# Patient Record
Sex: Female | Born: 1950 | Race: White | Hispanic: No | Marital: Married | Smoking: Former smoker
Health system: Southern US, Community
[De-identification: ages and names within clinical notes are randomized; demographics above are authoritative.]

## PROBLEM LIST (undated history)

## (undated) DIAGNOSIS — J189 Pneumonia, unspecified organism: Secondary | ICD-10-CM

## (undated) DIAGNOSIS — Z87442 Personal history of urinary calculi: Secondary | ICD-10-CM

## (undated) DIAGNOSIS — F1010 Alcohol abuse, uncomplicated: Secondary | ICD-10-CM

## (undated) DIAGNOSIS — J45909 Unspecified asthma, uncomplicated: Secondary | ICD-10-CM

## (undated) DIAGNOSIS — M1990 Unspecified osteoarthritis, unspecified site: Secondary | ICD-10-CM

## (undated) DIAGNOSIS — F419 Anxiety disorder, unspecified: Secondary | ICD-10-CM

## (undated) DIAGNOSIS — E46 Unspecified protein-calorie malnutrition: Secondary | ICD-10-CM

## (undated) DIAGNOSIS — M549 Dorsalgia, unspecified: Secondary | ICD-10-CM

## (undated) DIAGNOSIS — M797 Fibromyalgia: Secondary | ICD-10-CM

## (undated) DIAGNOSIS — K219 Gastro-esophageal reflux disease without esophagitis: Secondary | ICD-10-CM

## (undated) DIAGNOSIS — F39 Unspecified mood [affective] disorder: Secondary | ICD-10-CM

## (undated) DIAGNOSIS — F909 Attention-deficit hyperactivity disorder, unspecified type: Secondary | ICD-10-CM

## (undated) DIAGNOSIS — F329 Major depressive disorder, single episode, unspecified: Secondary | ICD-10-CM

## (undated) DIAGNOSIS — E876 Hypokalemia: Secondary | ICD-10-CM

## (undated) DIAGNOSIS — D509 Iron deficiency anemia, unspecified: Secondary | ICD-10-CM

## (undated) DIAGNOSIS — R609 Edema, unspecified: Secondary | ICD-10-CM

## (undated) DIAGNOSIS — I456 Pre-excitation syndrome: Secondary | ICD-10-CM

## (undated) DIAGNOSIS — F319 Bipolar disorder, unspecified: Secondary | ICD-10-CM

## (undated) DIAGNOSIS — F32A Depression, unspecified: Secondary | ICD-10-CM

## (undated) DIAGNOSIS — J42 Unspecified chronic bronchitis: Secondary | ICD-10-CM

## (undated) DIAGNOSIS — G8929 Other chronic pain: Secondary | ICD-10-CM

## (undated) DIAGNOSIS — E8809 Other disorders of plasma-protein metabolism, not elsewhere classified: Secondary | ICD-10-CM

## (undated) HISTORY — DX: Unspecified protein-calorie malnutrition: E46

## (undated) HISTORY — DX: Edema, unspecified: R60.9

## (undated) HISTORY — DX: Attention-deficit hyperactivity disorder, unspecified type: F90.9

## (undated) HISTORY — PX: TONSILLECTOMY: SUR1361

## (undated) HISTORY — DX: Major depressive disorder, single episode, unspecified: F32.9

## (undated) HISTORY — DX: Iron deficiency anemia, unspecified: D50.9

## (undated) HISTORY — PX: SUPRAVENTRICULAR TACHYCARDIA ABLATION: SHX6106

## (undated) HISTORY — DX: Pre-excitation syndrome: I45.6

## (undated) HISTORY — PX: BREAST BIOPSY: SHX20

## (undated) HISTORY — DX: Unspecified mood (affective) disorder: F39

## (undated) HISTORY — DX: Hypokalemia: E87.6

## (undated) HISTORY — DX: Depression, unspecified: F32.A

## (undated) HISTORY — DX: Bipolar disorder, unspecified: F31.9

## (undated) HISTORY — DX: Other disorders of plasma-protein metabolism, not elsewhere classified: E88.09

## (undated) HISTORY — DX: Alcohol abuse, uncomplicated: F10.10

---

## 1998-09-22 ENCOUNTER — Other Ambulatory Visit: Admission: RE | Admit: 1998-09-22 | Discharge: 1998-09-22 | Payer: Self-pay | Admitting: Family Medicine

## 2000-08-07 ENCOUNTER — Other Ambulatory Visit: Admission: RE | Admit: 2000-08-07 | Discharge: 2000-08-07 | Payer: Self-pay | Admitting: Family Medicine

## 2002-04-23 ENCOUNTER — Other Ambulatory Visit: Admission: RE | Admit: 2002-04-23 | Discharge: 2002-04-23 | Payer: Self-pay | Admitting: *Deleted

## 2002-06-11 HISTORY — PX: GASTRIC BYPASS: SHX52

## 2003-07-22 ENCOUNTER — Other Ambulatory Visit: Admission: RE | Admit: 2003-07-22 | Discharge: 2003-07-22 | Payer: Self-pay | Admitting: Family Medicine

## 2003-08-20 ENCOUNTER — Ambulatory Visit (HOSPITAL_COMMUNITY): Admission: RE | Admit: 2003-08-20 | Discharge: 2003-08-21 | Payer: Self-pay | Admitting: Internal Medicine

## 2003-11-10 HISTORY — PX: COLONOSCOPY: SHX174

## 2003-11-24 ENCOUNTER — Ambulatory Visit (HOSPITAL_COMMUNITY): Admission: RE | Admit: 2003-11-24 | Discharge: 2003-11-24 | Payer: Self-pay | Admitting: Gastroenterology

## 2004-04-24 ENCOUNTER — Encounter: Admission: RE | Admit: 2004-04-24 | Discharge: 2004-04-24 | Payer: Self-pay | Admitting: Family Medicine

## 2004-05-31 ENCOUNTER — Ambulatory Visit: Payer: Self-pay | Admitting: Internal Medicine

## 2004-05-31 ENCOUNTER — Ambulatory Visit: Payer: Self-pay | Admitting: *Deleted

## 2004-06-15 ENCOUNTER — Ambulatory Visit: Payer: Self-pay | Admitting: Internal Medicine

## 2004-07-06 ENCOUNTER — Ambulatory Visit: Payer: Self-pay | Admitting: Internal Medicine

## 2004-07-10 ENCOUNTER — Ambulatory Visit: Payer: Self-pay | Admitting: Internal Medicine

## 2004-07-11 ENCOUNTER — Ambulatory Visit (HOSPITAL_COMMUNITY): Admission: RE | Admit: 2004-07-11 | Discharge: 2004-07-12 | Payer: Self-pay | Admitting: Internal Medicine

## 2004-08-18 ENCOUNTER — Ambulatory Visit: Payer: Self-pay | Admitting: Internal Medicine

## 2005-08-19 ENCOUNTER — Emergency Department (HOSPITAL_COMMUNITY): Admission: EM | Admit: 2005-08-19 | Discharge: 2005-08-19 | Payer: Self-pay | Admitting: Emergency Medicine

## 2005-08-20 ENCOUNTER — Ambulatory Visit: Payer: Self-pay | Admitting: Psychiatry

## 2005-08-20 ENCOUNTER — Inpatient Hospital Stay (HOSPITAL_COMMUNITY): Admission: AD | Admit: 2005-08-20 | Discharge: 2005-08-24 | Payer: Self-pay | Admitting: Psychiatry

## 2005-08-28 ENCOUNTER — Other Ambulatory Visit (HOSPITAL_COMMUNITY): Admission: RE | Admit: 2005-08-28 | Discharge: 2005-09-24 | Payer: Self-pay | Admitting: Psychiatry

## 2006-03-01 ENCOUNTER — Encounter: Admission: RE | Admit: 2006-03-01 | Discharge: 2006-03-01 | Payer: Self-pay | Admitting: *Deleted

## 2007-04-24 ENCOUNTER — Encounter: Admission: RE | Admit: 2007-04-24 | Discharge: 2007-04-24 | Payer: Self-pay | Admitting: Family Medicine

## 2008-03-25 ENCOUNTER — Inpatient Hospital Stay (HOSPITAL_COMMUNITY): Admission: EM | Admit: 2008-03-25 | Discharge: 2008-04-02 | Payer: Self-pay | Admitting: Emergency Medicine

## 2008-03-25 HISTORY — PX: EXPLORATORY LAPAROTOMY: SUR591

## 2008-03-25 HISTORY — PX: LAPAROSCOPIC GASTROTOMY W/ REPAIR OF ULCER: SUR772

## 2008-07-12 HISTORY — PX: INCISION AND DRAINAGE OF WOUND: SHX1803

## 2008-07-15 ENCOUNTER — Ambulatory Visit (HOSPITAL_COMMUNITY): Admission: RE | Admit: 2008-07-15 | Discharge: 2008-07-17 | Payer: Self-pay | Admitting: General Surgery

## 2008-10-04 ENCOUNTER — Other Ambulatory Visit: Admission: RE | Admit: 2008-10-04 | Discharge: 2008-10-04 | Payer: Self-pay | Admitting: Family Medicine

## 2008-11-25 ENCOUNTER — Emergency Department (HOSPITAL_COMMUNITY): Admission: EM | Admit: 2008-11-25 | Discharge: 2008-11-26 | Payer: Self-pay | Admitting: Emergency Medicine

## 2008-11-26 ENCOUNTER — Inpatient Hospital Stay (HOSPITAL_COMMUNITY): Admission: AD | Admit: 2008-11-26 | Discharge: 2008-11-29 | Payer: Self-pay | Admitting: Psychiatry

## 2008-11-26 ENCOUNTER — Ambulatory Visit: Payer: Self-pay | Admitting: Psychiatry

## 2008-12-26 ENCOUNTER — Emergency Department (HOSPITAL_COMMUNITY): Admission: EM | Admit: 2008-12-26 | Discharge: 2008-12-27 | Payer: Self-pay | Admitting: Emergency Medicine

## 2009-01-17 ENCOUNTER — Emergency Department (HOSPITAL_COMMUNITY): Admission: EM | Admit: 2009-01-17 | Discharge: 2009-01-18 | Payer: Self-pay | Admitting: Emergency Medicine

## 2009-11-15 ENCOUNTER — Observation Stay (HOSPITAL_COMMUNITY): Admission: AD | Admit: 2009-11-15 | Discharge: 2009-11-17 | Payer: Self-pay | Admitting: Internal Medicine

## 2009-11-16 ENCOUNTER — Ambulatory Visit: Payer: Self-pay | Admitting: Surgery

## 2009-11-16 ENCOUNTER — Encounter (INDEPENDENT_AMBULATORY_CARE_PROVIDER_SITE_OTHER): Payer: Self-pay | Admitting: Internal Medicine

## 2009-11-16 DIAGNOSIS — F3112 Bipolar disorder, current episode manic without psychotic features, moderate: Secondary | ICD-10-CM

## 2009-11-17 DIAGNOSIS — F3112 Bipolar disorder, current episode manic without psychotic features, moderate: Secondary | ICD-10-CM

## 2009-12-07 ENCOUNTER — Emergency Department (HOSPITAL_COMMUNITY): Admission: EM | Admit: 2009-12-07 | Discharge: 2009-12-08 | Payer: Self-pay | Admitting: Emergency Medicine

## 2009-12-20 ENCOUNTER — Inpatient Hospital Stay (HOSPITAL_COMMUNITY): Admission: EM | Admit: 2009-12-20 | Discharge: 2009-12-22 | Payer: Self-pay | Admitting: Family Medicine

## 2010-04-28 IMAGING — CR DG UGI W/ GASTROGRAFIN
1 series · 1 of 1 positions shown · IV contrast (agent unspecified)
Comparison: 03/24/2008

CLINICAL DATA: Recent surgical repair of gastrojejunostomy leak.

GASTROGRAFIN UPPER GI SERIES
TECHNIQUE: Single-column upper GI series was performed using
Gastrografin.
Fluoroscopy Time: 2.1 minutes
Contrast: 50 ml of Imnipaque-G88 delivered by NG tube

[view not recorded]
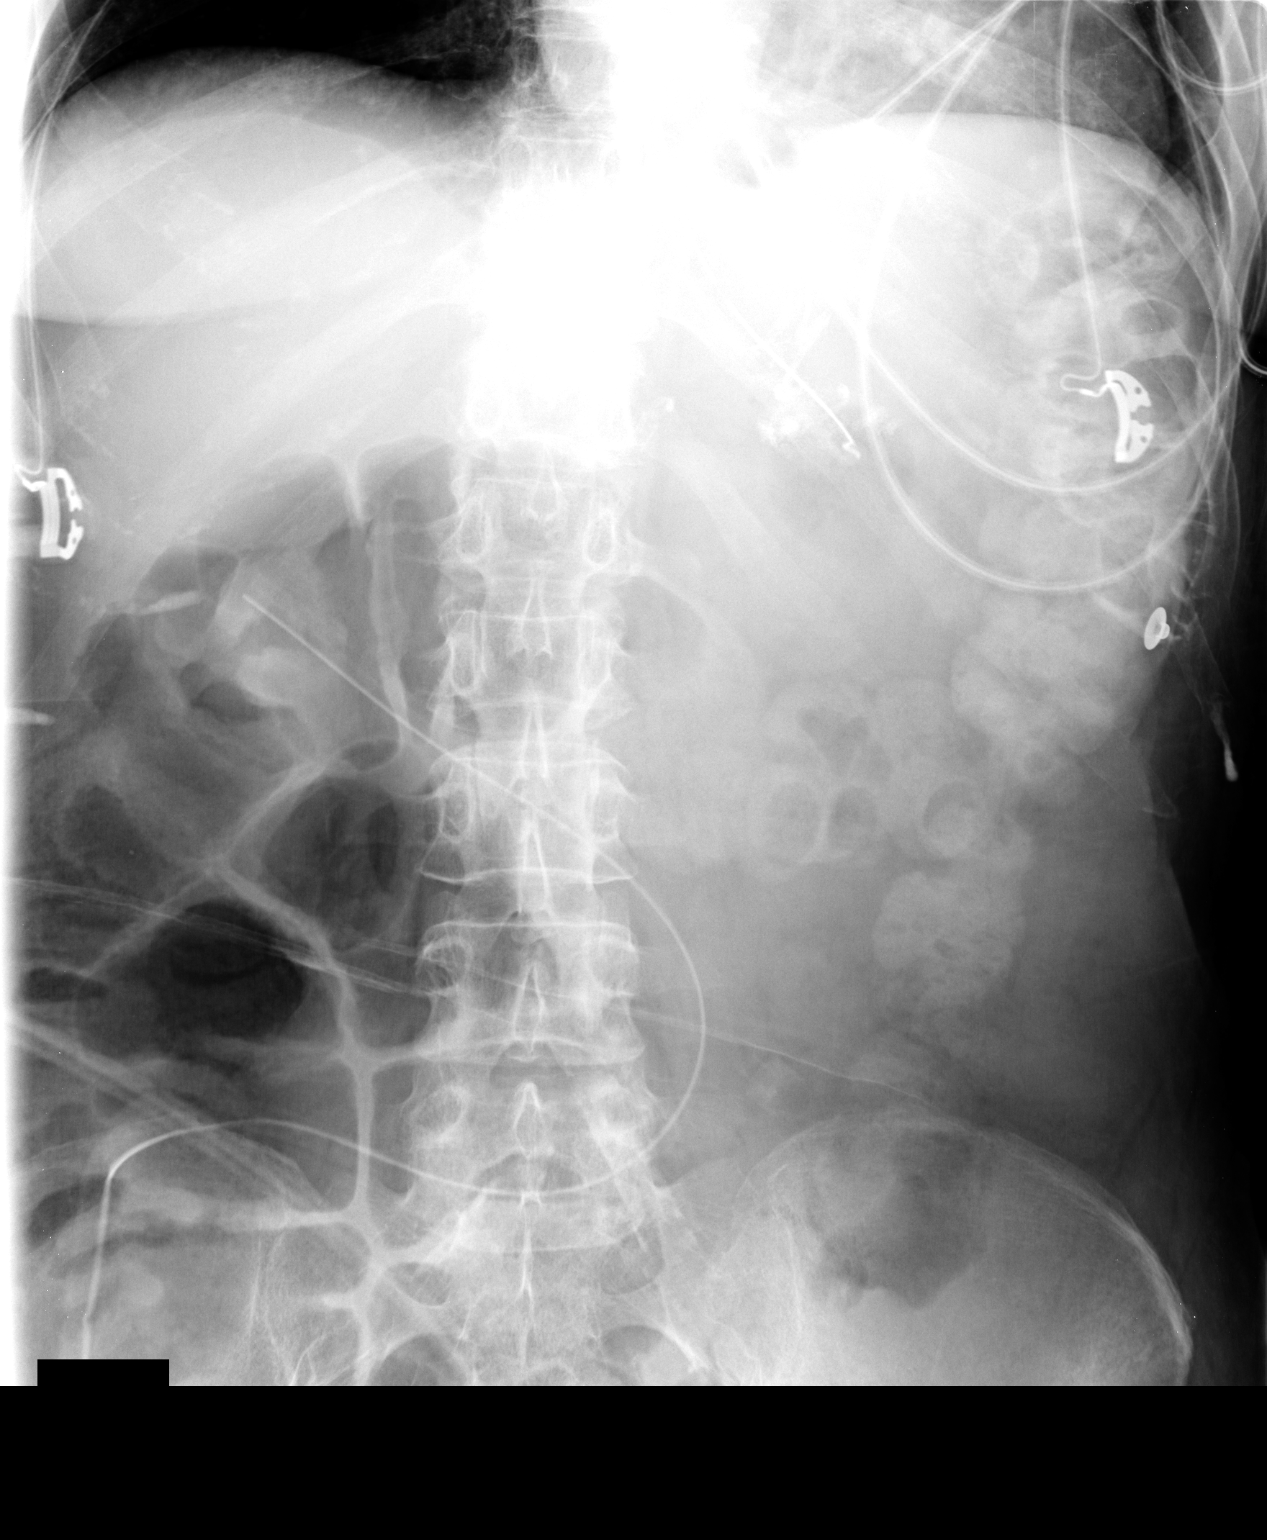

[1 of 1 positions shown; findings below may reference images not displayed]

FINDINGS: On the initial scout KUB, nasogastric tube is in place.
A tube also projects over the right abdomen.  Numerous small clips
are noted in the vicinity of the gastrojejunostomy.

Water soluble contrast medium was instilled through the nasogastric
tube.  The nasogastric tube side port is in the stomach and distal
tip is in the jejunum, spanning the gastrojejunostomy. However,
there appears to be contrast spanning the gastrojejunostomy outside
of the tube, and the gastrojejunostomy is thought to be patent.

Careful evaluation of the gastrojejunostomy at different degrees of
angulation demonstrates no leak.  Density in this vicinity
represents the localized clips and was present prior to
administration of contrast.

The visualized portions of the afferent and efferent loop appear
unremarkable. No gastroesophageal reflux was noted during today's
exam.
IMPRESSION: 1.  No evidence of leak in the vicinity of the gastrojejunostomy.
The jejunostomy is patent.

## 2010-06-02 ENCOUNTER — Observation Stay (HOSPITAL_COMMUNITY)
Admission: EM | Admit: 2010-06-02 | Discharge: 2010-06-05 | Payer: Self-pay | Source: Home / Self Care | Attending: Internal Medicine | Admitting: Internal Medicine

## 2010-06-03 ENCOUNTER — Encounter (INDEPENDENT_AMBULATORY_CARE_PROVIDER_SITE_OTHER): Payer: Self-pay | Admitting: Internal Medicine

## 2010-07-02 ENCOUNTER — Encounter: Payer: Self-pay | Admitting: Radiology

## 2010-07-10 ENCOUNTER — Ambulatory Visit (HOSPITAL_COMMUNITY)
Admission: RE | Admit: 2010-07-10 | Discharge: 2010-07-10 | Payer: Self-pay | Source: Home / Self Care | Attending: Family Medicine | Admitting: Family Medicine

## 2010-08-11 ENCOUNTER — Emergency Department (HOSPITAL_COMMUNITY)
Admission: EM | Admit: 2010-08-11 | Discharge: 2010-08-11 | Disposition: A | Discharge status: Home / Self Care | Payer: BC Managed Care – PPO | Attending: Emergency Medicine | Admitting: Emergency Medicine

## 2010-08-11 ENCOUNTER — Emergency Department (HOSPITAL_COMMUNITY): Payer: BC Managed Care – PPO

## 2010-08-11 DIAGNOSIS — M545 Low back pain, unspecified: Secondary | ICD-10-CM | POA: Insufficient documentation

## 2010-08-11 DIAGNOSIS — E8809 Other disorders of plasma-protein metabolism, not elsewhere classified: Secondary | ICD-10-CM | POA: Insufficient documentation

## 2010-08-11 DIAGNOSIS — M25559 Pain in unspecified hip: Secondary | ICD-10-CM | POA: Insufficient documentation

## 2010-08-11 DIAGNOSIS — I456 Pre-excitation syndrome: Secondary | ICD-10-CM | POA: Insufficient documentation

## 2010-08-11 DIAGNOSIS — M25569 Pain in unspecified knee: Secondary | ICD-10-CM | POA: Insufficient documentation

## 2010-08-11 DIAGNOSIS — F341 Dysthymic disorder: Secondary | ICD-10-CM | POA: Insufficient documentation

## 2010-08-11 DIAGNOSIS — S0990XA Unspecified injury of head, initial encounter: Secondary | ICD-10-CM | POA: Insufficient documentation

## 2010-08-11 DIAGNOSIS — E876 Hypokalemia: Secondary | ICD-10-CM | POA: Insufficient documentation

## 2010-08-11 DIAGNOSIS — R296 Repeated falls: Secondary | ICD-10-CM | POA: Insufficient documentation

## 2010-08-11 DIAGNOSIS — E7220 Disorder of urea cycle metabolism, unspecified: Secondary | ICD-10-CM | POA: Insufficient documentation

## 2010-08-11 DIAGNOSIS — IMO0002 Reserved for concepts with insufficient information to code with codable children: Secondary | ICD-10-CM | POA: Insufficient documentation

## 2010-08-11 LAB — CBC
HCT: 34.5 % — ABNORMAL LOW (ref 36.0–46.0)
Hemoglobin: 10.2 g/dL — ABNORMAL LOW (ref 12.0–15.0)
MCH: 27.3 pg (ref 26.0–34.0)
MCHC: 29.6 g/dL — ABNORMAL LOW (ref 30.0–36.0)
MCV: 92.5 fL (ref 78.0–100.0)

## 2010-08-11 LAB — COMPREHENSIVE METABOLIC PANEL
AST: 35 U/L (ref 0–37)
Albumin: 2.2 g/dL — ABNORMAL LOW (ref 3.5–5.2)
Alkaline Phosphatase: 120 U/L — ABNORMAL HIGH (ref 39–117)
Chloride: 106 mEq/L (ref 96–112)
GFR calc Af Amer: >60 mL/min (ref >60)
Potassium: 3 mEq/L — ABNORMAL LOW (ref 3.5–5.1)
Total Bilirubin: 0.6 mg/dL (ref 0.3–1.2)
Total Protein: 5.1 g/dL — ABNORMAL LOW (ref 6.0–8.3)

## 2010-08-11 LAB — AMMONIA: Ammonia: 65 umol/L — ABNORMAL HIGH (ref 11–35)

## 2010-08-21 LAB — CBC
HCT: 34.9 % — ABNORMAL LOW (ref 36.0–46.0)
Hemoglobin: 10.8 g/dL — ABNORMAL LOW (ref 12.0–15.0)
MCH: 26.2 pg (ref 26.0–34.0)
MCHC: 30.9 g/dL (ref 30.0–36.0)
RBC: 4.13 MIL/uL (ref 3.87–5.11)

## 2010-08-21 LAB — COMPREHENSIVE METABOLIC PANEL
ALT: 29 U/L (ref 0–35)
AST: 19 U/L (ref 0–37)
AST: 30 U/L (ref 0–37)
BUN: 21 mg/dL (ref 6–23)
CO2: 28 mEq/L (ref 19–32)
CO2: 29 mEq/L (ref 19–32)
Calcium: 7.5 mg/dL — ABNORMAL LOW (ref 8.4–10.5)
Calcium: 8 mg/dL — ABNORMAL LOW (ref 8.4–10.5)
Chloride: 100 mEq/L (ref 96–112)
Chloride: 105 mEq/L (ref 96–112)
Creatinine, Ser: 0.78 mg/dL (ref 0.4–1.2)
GFR calc Af Amer: >60 mL/min (ref >60)
GFR calc non Af Amer: >60 mL/min (ref >60)
GFR calc non Af Amer: >60 mL/min (ref >60)
Glucose, Bld: 101 mg/dL — ABNORMAL HIGH (ref 70–99)
Sodium: 136 mEq/L (ref 135–145)
Total Bilirubin: 0.5 mg/dL (ref 0.3–1.2)
Total Bilirubin: 1 mg/dL (ref 0.3–1.2)

## 2010-08-21 LAB — BASIC METABOLIC PANEL
BUN: 14 mg/dL (ref 6–23)
CO2: 28 mEq/L (ref 19–32)
Chloride: 107 mEq/L (ref 96–112)
Creatinine, Ser: 0.9 mg/dL (ref 0.4–1.2)
GFR calc Af Amer: >60 mL/min (ref >60)
GFR calc non Af Amer: >60 mL/min (ref >60)
Glucose, Bld: 116 mg/dL — ABNORMAL HIGH (ref 70–99)
Glucose, Bld: 161 mg/dL — ABNORMAL HIGH (ref 70–99)
Potassium: 3 mEq/L — ABNORMAL LOW (ref 3.5–5.1)
Sodium: 139 mEq/L (ref 135–145)

## 2010-08-21 LAB — INFLUENZA B ABS IGG & IGM

## 2010-08-21 LAB — LIPASE, BLOOD: Lipase: 16 U/L (ref 11–59)

## 2010-08-21 LAB — CARDIAC PANEL(CRET KIN+CKTOT+MB+TROPI)
CK, MB: 2.2 ng/mL (ref 0.3–4.0)
Relative Index: INVALID (ref 0.0–2.5)
Total CK: 83 U/L (ref 7–177)

## 2010-08-21 LAB — DIFFERENTIAL
Basophils Absolute: 0 10*3/uL (ref 0.0–0.1)
Basophils Relative: 0 % (ref 0–1)
Eosinophils Absolute: 0 10*3/uL (ref 0.0–0.7)
Eosinophils Relative: 0 % (ref 0–5)
Lymphs Abs: 1 10*3/uL (ref 0.7–4.0)
Neutrophils Relative %: 82 % — ABNORMAL HIGH (ref 43–77)

## 2010-08-21 LAB — LITHIUM LEVEL: Lithium Lvl: 0.33 mEq/L — ABNORMAL LOW (ref 0.80–1.40)

## 2010-08-21 LAB — URINALYSIS, ROUTINE W REFLEX MICROSCOPIC
Bilirubin Urine: NEGATIVE
Glucose, UA: NEGATIVE mg/dL
Hgb urine dipstick: NEGATIVE
Nitrite: NEGATIVE
Urobilinogen, UA: 0.2 mg/dL (ref 0.0–1.0)
pH: 6.5 (ref 5.0–8.0)

## 2010-08-21 LAB — INFLUENZA A ABS IGG & IGM

## 2010-08-21 LAB — VITAMIN B12: Vitamin B-12: 903 pg/mL (ref 211–911)

## 2010-08-21 LAB — IRON AND TIBC
Saturation Ratios: 9 % — ABNORMAL LOW (ref 20–55)
UIBC: 231 ug/dL

## 2010-08-21 LAB — PHOSPHORUS: Phosphorus: 1.9 mg/dL — ABNORMAL LOW (ref 2.3–4.6)

## 2010-08-21 LAB — ETHANOL: Alcohol, Ethyl (B): <5 mg/dL (ref 0–10)

## 2010-08-21 LAB — POTASSIUM: Potassium: 3.7 mEq/L (ref 3.5–5.1)

## 2010-08-21 LAB — BRAIN NATRIURETIC PEPTIDE: Pro B Natriuretic peptide (BNP): 38.4 pg/mL (ref 0.0–100.0)

## 2010-08-21 LAB — PROTIME-INR: Prothrombin Time: 13.2 seconds (ref 11.6–15.2)

## 2010-08-27 LAB — COMPREHENSIVE METABOLIC PANEL
Albumin: 3.1 g/dL — ABNORMAL LOW (ref 3.5–5.2)
Alkaline Phosphatase: 165 U/L — ABNORMAL HIGH (ref 39–117)
BUN: 25 mg/dL — ABNORMAL HIGH (ref 6–23)
BUN: 36 mg/dL — ABNORMAL HIGH (ref 6–23)
CO2: 28 mEq/L (ref 19–32)
Calcium: 8.2 mg/dL — ABNORMAL LOW (ref 8.4–10.5)
Calcium: 8.7 mg/dL (ref 8.4–10.5)
Chloride: 106 mEq/L (ref 96–112)
Creatinine, Ser: 0.89 mg/dL (ref 0.4–1.2)
Creatinine, Ser: 1.23 mg/dL — ABNORMAL HIGH (ref 0.4–1.2)
GFR calc non Af Amer: >60 mL/min (ref >60)
Glucose, Bld: 132 mg/dL — ABNORMAL HIGH (ref 70–99)
Potassium: 3.8 mEq/L (ref 3.5–5.1)
Total Bilirubin: 0.7 mg/dL (ref 0.3–1.2)
Total Protein: 6.1 g/dL (ref 6.0–8.3)

## 2010-08-27 LAB — DIFFERENTIAL
Basophils Absolute: 0 10*3/uL (ref 0.0–0.1)
Lymphocytes Relative: 14 % (ref 12–46)
Lymphs Abs: 1.5 10*3/uL (ref 0.7–4.0)
Neutro Abs: 8.7 10*3/uL — ABNORMAL HIGH (ref 1.7–7.7)

## 2010-08-27 LAB — CARDIAC PANEL(CRET KIN+CKTOT+MB+TROPI)
Relative Index: INVALID (ref 0.0–2.5)
Troponin I: 0.02 ng/mL (ref 0.00–0.06)

## 2010-08-27 LAB — SEDIMENTATION RATE: Sed Rate: 8 mm/hr (ref 0–22)

## 2010-08-27 LAB — URINALYSIS, ROUTINE W REFLEX MICROSCOPIC
Glucose, UA: NEGATIVE mg/dL
Ketones, ur: NEGATIVE mg/dL
Nitrite: NEGATIVE
pH: 5 (ref 5.0–8.0)

## 2010-08-27 LAB — CBC
HCT: 36.2 % (ref 36.0–46.0)
Hemoglobin: 10.5 g/dL — ABNORMAL LOW (ref 12.0–15.0)
MCH: 29.6 pg (ref 26.0–34.0)
MCH: 29.9 pg (ref 26.0–34.0)
MCHC: 32.5 g/dL (ref 30.0–36.0)
MCHC: 32.5 g/dL (ref 30.0–36.0)
MCV: 91.1 fL (ref 78.0–100.0)
MCV: 92.1 fL (ref 78.0–100.0)
Platelets: 548 10*3/uL — ABNORMAL HIGH (ref 150–400)
Platelets: 630 10*3/uL — ABNORMAL HIGH (ref 150–400)
RBC: 3.51 MIL/uL — ABNORMAL LOW (ref 3.87–5.11)
RDW: 19.3 % — ABNORMAL HIGH (ref 11.5–15.5)
WBC: 14.1 10*3/uL — ABNORMAL HIGH (ref 4.0–10.5)

## 2010-08-27 LAB — TSH
TSH: 2.739 u[IU]/mL (ref 0.350–4.500)
TSH: 2.902 u[IU]/mL (ref 0.350–4.500)

## 2010-08-27 LAB — LITHIUM LEVEL: Lithium Lvl: 0.39 mEq/L — ABNORMAL LOW (ref 0.80–1.40)

## 2010-08-27 LAB — LACTATE DEHYDROGENASE: LDH: 170 U/L (ref 94–250)

## 2010-08-27 LAB — BASIC METABOLIC PANEL
BUN: 18 mg/dL (ref 6–23)
Calcium: 8 mg/dL — ABNORMAL LOW (ref 8.4–10.5)
Creatinine, Ser: 0.6 mg/dL (ref 0.4–1.2)
GFR calc non Af Amer: >60 mL/min (ref >60)

## 2010-08-27 LAB — PREALBUMIN: Prealbumin: 24.6 mg/dL (ref 18.0–45.0)

## 2010-08-27 LAB — URINE CULTURE

## 2010-08-28 LAB — TSH: TSH: 2.342 u[IU]/mL (ref 0.350–4.500)

## 2010-08-28 LAB — BASIC METABOLIC PANEL
BUN: 16 mg/dL (ref 6–23)
CO2: 26 mEq/L (ref 19–32)
Calcium: 8.4 mg/dL (ref 8.4–10.5)
Chloride: 104 mEq/L (ref 96–112)
Chloride: 110 mEq/L (ref 96–112)
Creatinine, Ser: 0.77 mg/dL (ref 0.4–1.2)
GFR calc Af Amer: >60 mL/min (ref >60)
GFR calc Af Amer: >60 mL/min (ref >60)
GFR calc non Af Amer: 54 mL/min — ABNORMAL LOW (ref >60)
Glucose, Bld: 95 mg/dL (ref 70–99)
Potassium: 3.5 mEq/L (ref 3.5–5.1)
Sodium: 135 mEq/L (ref 135–145)

## 2010-08-28 LAB — DIFFERENTIAL
Basophils Absolute: 0 10*3/uL (ref 0.0–0.1)
Eosinophils Absolute: 0 10*3/uL (ref 0.0–0.7)
Eosinophils Relative: 0 % (ref 0–5)
Lymphocytes Relative: 17 % (ref 12–46)
Neutrophils Relative %: 78 % — ABNORMAL HIGH (ref 43–77)

## 2010-08-28 LAB — URINE CULTURE

## 2010-08-28 LAB — PREALBUMIN: Prealbumin: 16.9 mg/dL — ABNORMAL LOW (ref 18.0–45.0)

## 2010-08-28 LAB — COMPREHENSIVE METABOLIC PANEL
AST: 47 U/L — ABNORMAL HIGH (ref 0–37)
CO2: 26 mEq/L (ref 19–32)
Chloride: 102 mEq/L (ref 96–112)
Creatinine, Ser: 1.18 mg/dL (ref 0.4–1.2)
GFR calc Af Amer: 57 mL/min — ABNORMAL LOW (ref >60)
GFR calc non Af Amer: 47 mL/min — ABNORMAL LOW (ref >60)
Glucose, Bld: 158 mg/dL — ABNORMAL HIGH (ref 70–99)
Total Bilirubin: 0.9 mg/dL (ref 0.3–1.2)

## 2010-08-28 LAB — CBC
HCT: 33.2 % — ABNORMAL LOW (ref 36.0–46.0)
Hemoglobin: 10.6 g/dL — ABNORMAL LOW (ref 12.0–15.0)
MCHC: 32.1 g/dL (ref 30.0–36.0)
MCHC: 32.4 g/dL (ref 30.0–36.0)
MCV: 84.2 fL (ref 78.0–100.0)
MCV: 84.7 fL (ref 78.0–100.0)
RBC: 3.94 MIL/uL (ref 3.87–5.11)
RBC: 3.99 MIL/uL (ref 3.87–5.11)
WBC: 6.3 10*3/uL (ref 4.0–10.5)

## 2010-08-28 LAB — URINALYSIS, ROUTINE W REFLEX MICROSCOPIC
Nitrite: NEGATIVE
Specific Gravity, Urine: 1.015 (ref 1.005–1.030)
Urobilinogen, UA: 0.2 mg/dL (ref 0.0–1.0)

## 2010-08-28 LAB — LIPID PANEL
Cholesterol: 176 mg/dL (ref 0–200)
LDL Cholesterol: 84 mg/dL (ref 0–99)
VLDL: 14 mg/dL (ref 0–40)

## 2010-08-28 LAB — MAGNESIUM: Magnesium: 2.1 mg/dL (ref 1.5–2.5)

## 2010-08-28 LAB — HEMOCCULT GUIAC POC 1CARD (OFFICE): Fecal Occult Bld: NEGATIVE

## 2010-09-16 LAB — URINALYSIS, ROUTINE W REFLEX MICROSCOPIC
Ketones, ur: NEGATIVE mg/dL
Protein, ur: NEGATIVE mg/dL
Urobilinogen, UA: 0.2 mg/dL (ref 0.0–1.0)

## 2010-09-16 LAB — CBC
MCHC: 31.1 g/dL (ref 30.0–36.0)
MCV: 74.1 fL — ABNORMAL LOW (ref 78.0–100.0)
Platelets: 408 10*3/uL — ABNORMAL HIGH (ref 150–400)
RDW: 20.6 % — ABNORMAL HIGH (ref 11.5–15.5)
WBC: 9.1 10*3/uL (ref 4.0–10.5)

## 2010-09-16 LAB — BASIC METABOLIC PANEL
BUN: 7 mg/dL (ref 6–23)
CO2: 21 mEq/L (ref 19–32)
Chloride: 107 mEq/L (ref 96–112)
Creatinine, Ser: 0.48 mg/dL (ref 0.4–1.2)
Glucose, Bld: 116 mg/dL — ABNORMAL HIGH (ref 70–99)

## 2010-09-16 LAB — RAPID URINE DRUG SCREEN, HOSP PERFORMED: Barbiturates: NOT DETECTED

## 2010-09-16 LAB — DIFFERENTIAL
Basophils Absolute: 0 10*3/uL (ref 0.0–0.1)
Basophils Relative: 0 % (ref 0–1)
Eosinophils Absolute: 0 10*3/uL (ref 0.0–0.7)
Neutrophils Relative %: 78 % — ABNORMAL HIGH (ref 43–77)

## 2010-09-16 LAB — ETHANOL
Alcohol, Ethyl (B): 212 mg/dL — ABNORMAL HIGH (ref 0–10)
Alcohol, Ethyl (B): 432 mg/dL (ref 0–10)

## 2010-09-17 LAB — CBC
HCT: 39.5 % (ref 36.0–46.0)
Hemoglobin: 12.4 g/dL (ref 12.0–15.0)
MCHC: 31.3 g/dL (ref 30.0–36.0)
Platelets: 409 10*3/uL — ABNORMAL HIGH (ref 150–400)
RDW: 21.8 % — ABNORMAL HIGH (ref 11.5–15.5)

## 2010-09-17 LAB — DIFFERENTIAL
Basophils Absolute: 0 10*3/uL (ref 0.0–0.1)
Eosinophils Absolute: 0.1 10*3/uL (ref 0.0–0.7)
Lymphs Abs: 2.4 10*3/uL (ref 0.7–4.0)
Monocytes Absolute: 0.5 10*3/uL (ref 0.1–1.0)
Monocytes Relative: 5 % (ref 3–12)
Neutro Abs: 7.4 10*3/uL (ref 1.7–7.7)

## 2010-09-17 LAB — BASIC METABOLIC PANEL
BUN: 9 mg/dL (ref 6–23)
CO2: 25 mEq/L (ref 19–32)
GFR calc non Af Amer: >60 mL/min (ref >60)
Glucose, Bld: 113 mg/dL — ABNORMAL HIGH (ref 70–99)
Potassium: 3.3 mEq/L — ABNORMAL LOW (ref 3.5–5.1)

## 2010-09-17 LAB — RAPID URINE DRUG SCREEN, HOSP PERFORMED
Amphetamines: NOT DETECTED
Barbiturates: NOT DETECTED
Benzodiazepines: NOT DETECTED
Opiates: NOT DETECTED

## 2010-09-18 LAB — COMPREHENSIVE METABOLIC PANEL
Albumin: 3.4 g/dL — ABNORMAL LOW (ref 3.5–5.2)
Alkaline Phosphatase: 123 U/L — ABNORMAL HIGH (ref 39–117)
BUN: 13 mg/dL (ref 6–23)
Calcium: 8.1 mg/dL — ABNORMAL LOW (ref 8.4–10.5)
Creatinine, Ser: 0.57 mg/dL (ref 0.4–1.2)
Glucose, Bld: 106 mg/dL — ABNORMAL HIGH (ref 70–99)
Potassium: 3.8 mEq/L (ref 3.5–5.1)
Total Protein: 6.1 g/dL (ref 6.0–8.3)

## 2010-09-18 LAB — RAPID URINE DRUG SCREEN, HOSP PERFORMED
Barbiturates: NOT DETECTED
Cocaine: NOT DETECTED
Opiates: NOT DETECTED

## 2010-09-18 LAB — DIFFERENTIAL
Basophils Relative: 1 % (ref 0–1)
Lymphocytes Relative: 17 % (ref 12–46)
Monocytes Absolute: 0.7 10*3/uL (ref 0.1–1.0)
Monocytes Relative: 6 % (ref 3–12)
Neutro Abs: 9 10*3/uL — ABNORMAL HIGH (ref 1.7–7.7)
Neutrophils Relative %: 76 % (ref 43–77)

## 2010-09-18 LAB — URINALYSIS, ROUTINE W REFLEX MICROSCOPIC
Hgb urine dipstick: NEGATIVE
Nitrite: NEGATIVE
Specific Gravity, Urine: 1.012 (ref 1.005–1.030)
Urobilinogen, UA: 0.2 mg/dL (ref 0.0–1.0)
pH: 6 (ref 5.0–8.0)

## 2010-09-18 LAB — CBC
HCT: 37.2 % (ref 36.0–46.0)
Hemoglobin: 11.6 g/dL — ABNORMAL LOW (ref 12.0–15.0)
MCHC: 31.1 g/dL (ref 30.0–36.0)
MCV: 73.8 fL — ABNORMAL LOW (ref 78.0–100.0)
Platelets: 361 10*3/uL (ref 150–400)
RDW: 20.3 % — ABNORMAL HIGH (ref 11.5–15.5)

## 2010-09-18 LAB — ETHANOL: Alcohol, Ethyl (B): 261 mg/dL — ABNORMAL HIGH (ref 0–10)

## 2010-09-26 LAB — CBC
HCT: 33.8 % — ABNORMAL LOW (ref 36.0–46.0)
Hemoglobin: 11 g/dL — ABNORMAL LOW (ref 12.0–15.0)
MCHC: 32.4 g/dL (ref 30.0–36.0)
MCV: 79.8 fL (ref 78.0–100.0)
Platelets: 380 10*3/uL (ref 150–400)
RDW: 16.3 % — ABNORMAL HIGH (ref 11.5–15.5)

## 2010-09-26 LAB — BASIC METABOLIC PANEL
BUN: 7 mg/dL (ref 6–23)
CO2: 29 mEq/L (ref 19–32)
Chloride: 109 mEq/L (ref 96–112)
GFR calc non Af Amer: >60 mL/min (ref >60)
Glucose, Bld: 100 mg/dL — ABNORMAL HIGH (ref 70–99)
Glucose, Bld: 102 mg/dL — ABNORMAL HIGH (ref 70–99)
Potassium: 3.1 mEq/L — ABNORMAL LOW (ref 3.5–5.1)
Potassium: 3.5 mEq/L (ref 3.5–5.1)
Sodium: 138 mEq/L (ref 135–145)
Sodium: 139 mEq/L (ref 135–145)

## 2010-09-26 LAB — DIFFERENTIAL
Basophils Absolute: 0 10*3/uL (ref 0.0–0.1)
Basophils Relative: 1 % (ref 0–1)
Eosinophils Absolute: 0.1 10*3/uL (ref 0.0–0.7)
Eosinophils Relative: 1 % (ref 0–5)
Monocytes Absolute: 0.6 10*3/uL (ref 0.1–1.0)

## 2010-09-26 LAB — WOUND CULTURE

## 2010-09-26 LAB — ANAEROBIC CULTURE

## 2010-09-26 LAB — POCT I-STAT 4, (NA,K, GLUC, HGB,HCT): Glucose, Bld: 90 mg/dL (ref 70–99)

## 2010-09-28 ENCOUNTER — Encounter: Payer: Self-pay | Admitting: Internal Medicine

## 2010-09-29 ENCOUNTER — Ambulatory Visit (INDEPENDENT_AMBULATORY_CARE_PROVIDER_SITE_OTHER): Payer: BC Managed Care – PPO | Admitting: Internal Medicine

## 2010-09-29 ENCOUNTER — Encounter: Payer: Self-pay | Admitting: Internal Medicine

## 2010-09-29 DIAGNOSIS — R002 Palpitations: Secondary | ICD-10-CM

## 2010-09-29 DIAGNOSIS — I10 Essential (primary) hypertension: Secondary | ICD-10-CM

## 2010-09-29 NOTE — Patient Instructions (Signed)
Your physician recommends that you schedule a follow-up appointment as needed  

## 2010-10-01 ENCOUNTER — Encounter: Payer: Self-pay | Admitting: Internal Medicine

## 2010-10-01 DIAGNOSIS — I10 Essential (primary) hypertension: Secondary | ICD-10-CM | POA: Insufficient documentation

## 2010-10-01 DIAGNOSIS — R002 Palpitations: Secondary | ICD-10-CM | POA: Insufficient documentation

## 2010-10-01 NOTE — Progress Notes (Signed)
HPI [name] returns today for followup. She is a pleasant middle age woman with a h/o SVT status post ablation who I have not seen for several years. She has not had any recurrent SVT but does note intermittant palpitations. She has been under increased stress and admits to not eating as she should. She has not had any syncope. She denies c/p. She continues to exercise. She has had trouble sleeping. She has taken some Trazedone on top of her other meds. Allergies  Allergen Reactions  . Prednisolone Acetate      Current Outpatient Prescriptions  Medication Sig Dispense Refill  . Albuterol Sulfate (PROAIR HFA IN) Inhale into the lungs. prn       . ARIPiprazole (ABILIFY) 20 MG tablet Take 20 mg by mouth daily. Take 1/2 tab daily       . azelastine (ASTELIN) 137 MCG/SPRAY nasal spray 1 spray by Nasal route 2 (two) times daily. Use in each nostril as directed       . benzonatate (TESSALON) 100 MG capsule Take 100 mg by mouth 3 (three) times daily as needed.        . budesonide-formoterol (SYMBICORT) 160-4.5 MCG/ACT inhaler Inhale 2 puffs into the lungs 2 (two) times daily. prn       . citalopram (CELEXA) 20 MG tablet Take 20 mg by mouth daily.        . clonazePAM (KLONOPIN) 1 MG tablet Take 1 mg by mouth 3 (three) times daily as needed.        Marland Kitchen dexlansoprazole (DEXILANT) 60 MG capsule Take 60 mg by mouth daily.        . diclofenac sodium (VOLTAREN) 1 % GEL Apply topically. At least once a day       . ergocalciferol (VITAMIN D2) 50000 UNITS capsule Take 1 Units by mouth 2 (two) times a week.        . fluticasone (FLONASE) 50 MCG/ACT nasal spray 2 sprays by Nasal route daily.        . Fluticasone-Salmeterol (ADVAIR DISKUS) 250-50 MCG/DOSE AEPB Inhale 1 puff into the lungs every 12 (twelve) hours. prn       . furosemide (LASIX) 40 MG tablet Take 40 mg by mouth daily.        Marland Kitchen lidocaine (LIDODERM) 5 % Place 1 patch onto the skin daily. Remove & Discard patch within 12 hours or as directed by  MD       . Multiple Vitamin (MULTIVITAMIN) tablet Take 1 tablet by mouth 3 (three) times daily.        . promethazine (PHENERGAN) 25 MG tablet Take 25 mg by mouth every 6 (six) hours as needed.        . traMADol (ULTRAM) 50 MG tablet Take 50 mg by mouth every 6 (six) hours as needed.        . triamterene-hydrochlorothiazide (MAXZIDE-25) 37.5-25 MG per tablet Take 1 tablet by mouth daily.        Marland Kitchen zolpidem (AMBIEN) 10 MG tablet Take 10 mg by mouth at bedtime as needed.           Past Medical History  Diagnosis Date  . Chronic edema     LOWER EXTREMITY  . Hypoalbuminemia   . Chronic hypokalemia   . Mood disorder   . Bipolar 1 disorder   . Iron deficiency anemia   . Attention deficit disorder   . Hyperactivity disorder   . Chronic alcohol abuse   . Wolff-Parkinson-White syndrome  status post ablation x 2  . Malnutrition   . Depression     ROS:   All systems reviewed and negative except as noted in the HPI.   Past Surgical History  Procedure Date  . Gastric bypass sur   . Gastric bypass   . Laparotomy   . Laparoscopic gastrotomy w/ repair of ulcer      Family History  Problem Relation Age of Onset  . Other Father 49    cardiac death  . Breast cancer Mother     deceased  . Diabetes Brother      History   Social History  . Marital Status: Married    Spouse Name: N/A    Number of Children: N/A  . Years of Education: N/A   Occupational History  . Not on file.   Social History Main Topics  . Smoking status: Former Smoker    Types: Cigarettes    Quit date: 06/11/1988  . Smokeless tobacco: Not on file  . Alcohol Use: Yes  . Drug Use: Yes     marijuana  . Sexually Active: Not on file   Other Topics Concern  . Not on file   Social History Narrative  . No narrative on file     BP 111/76  Pulse 87  Ht 5\' 4"  (1.626 m)  Wt 114 lb (51.71 kg)  BMI 19.57 kg/m2  Physical Exam:  Well appearing NAD HEENT: Unremarkable Neck:  No JVD, no  thyromegally Lymphatics:  No adenopathy Back:  No CVA tenderness Lungs:  Clear with no wheezes. HEART:  Regular rate rhythm, no murmurs, no rubs, no clicks Abd:  Flat, positive bowel sounds, no organomegally, no rebound, no guarding Ext:  2 plus pulses, no edema, no cyanosis, no clubbing Skin:  No rashes no nodules Neuro:  CN II through XII intact, motor grossly intact  EKG NSR with frequent PAC's.  Assess/Plan:

## 2010-10-01 NOTE — Assessment & Plan Note (Signed)
Her blood pressure is well controlled. I will ask her to reduce her sodium intake but would like for her to stop her Maxzide.

## 2010-10-01 NOTE — Assessment & Plan Note (Signed)
Her symptoms are moderately severe. I have discussed the benign nature. Additional medication could be considered but I have recommended a period of watchful waiting.

## 2010-10-24 NOTE — Op Note (Signed)
NAMEARIONNA, HOGGARD           ACCOUNT NO.:  [id number]   MEDICAL RECORD NO.:  [id number]          PATIENT TYPE:  OIB   LOCATION:  5127                         FACILITY:  [facility]   PHYSICIAN:  Cherylynn Ridges, M.D.    DATE OF BIRTH:  1950/09/23   DATE OF PROCEDURE:  07/15/2008  DATE OF DISCHARGE:                               OPERATIVE REPORT   PREOPERATIVE DIAGNOSIS:  Chronic wound infection, status post  exploratory laparotomy for bowel perforation.   POSTOPERATIVE DIAGNOSIS:  Chronic wound infection, status post  exploratory laparotomy for bowel perforation with disintegration of the  anterior rectus fascia on the left side.   PROCEDURE:  Exploration, debridement, and repair of chronic open wound  with 10 x 5 cm piece of Strattice biologic prosthesis.   SURGEON:  Cherylynn Ridges, MD   ANESTHESIA:  General with laryngeal airway.   ESTIMATED BLOOD LOSS:  Less than 50 mL.   COMPLICATIONS:  None.   CONDITION:  Stable.   FINDINGS:  The patient had an 8 x 4 cm anterior rectus sheath fascial  opening on the left side after debridement of the chronically draining  wound.  There were pieces of the PDS.  The suture which had been  bleached and was falling apart on the left side, which we pulled out in  the chronically infected wound.  Aerobic and anaerobic cultures were  sent.  Anterior fascial opening about 8 x 4 cm in size was left, which  we covered with Strattice prosthesis.   OPERATION:  The patient was taken to the operating room and placed on  table in supine position.  After an adequate general laryngeal airway  anesthetic was administered, she was prepped and draped in usual sterile  manner exposing the midline.   We excised the patient's skin incision down into the subcutaneous  tissue.  We obtained adequate hemostasis with electrocautery and  dissected down into the chronically inflamed and infected cavity into  the anterior abdominal wall.  This extended into a pocket  on the left  anterior midline area, which after debridement measured about 8 x 4 cm  in size and was just a necrotic debris little like whole chronically  infected and necrotic muscle with no anterior sheath in that area.  There was a small opening in the posterior sheath.  After debridement,  which we repaired with 2 interrupted stitches of old Novofil suture.  We  debrided all necrotic and chronic scar tissue down to get fascia  circumferentially measuring about 8 x 4 cm in size.  We covered this by  attaching a piece of status mesh measuring approximately 10 x 5 cm in  size and an oval shaped to the fascia surrounding the defect using  interrupted 0 Novofil suture.  We irrigated with saline.  No antibiotic  solution was given; however, the patient did get preoperative  vancomycin.  Once the Strattice mesh was placed in the wound, we  irrigated with saline and then covered it with a piece of Adaptic and  then I placed a VAC dressing on top, keeping it in place.  There was  good suction of the VAC dressing.  All counts were correct.  The patient  was taken to the recovery room in stable condition.      Cherylynn Ridges, M.D.  Electronically Signed     JOW/MEDQ  D:  07/15/2008  T:  07/15/2008  Job:  [id number]
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
## 2010-10-24 NOTE — Op Note (Signed)
[name], [name]           ACCOUNT NO.:  [id number]   MEDICAL RECORD NO.:  [id number]          PATIENT TYPE:  INP   LOCATION:  5524                         FACILITY:  [facility]   PHYSICIAN:  Cherylynn Ridges, M.D.    DATE OF BIRTH:  [birth year]   DATE OF PROCEDURE:  03/25/2008  DATE OF DISCHARGE:                               OPERATIVE REPORT   PREOPERATIVE DIAGNOSIS:  Likely loop of bowel obstruction with possible  ischemic bowel status post laparoscopic gastric bypass with  gastrojejunostomy.   POSTOPERATIVE DIAGNOSIS:  Perforating gastrojejunostomy ulcer with free  air.   PROCEDURE:  1. Diagnostic laparoscopy.  2. Exploratory laparotomy.  3. Repair of gastrojejunostomy ulcer with an omental patch.   SURGEON:  Cherylynn Ridges, MD   ASSISTANT:  Lennie Muckle, MD   ANESTHESIA:  General endotracheal.   ESTIMATED BLOOD LOSS:  Less than 50 mL.   COMPLICATIONS:  None.   CONDITION:  Stable.   No specimens were sent.   INDICATIONS FOR OPERATION:  The patient is a 60 year old who in 2004  underwent a laparoscopic gastric bypass with a loop gastrojejunostomy.  She started having severe abdominal pain about noon today with diffuse  peritonitis.  She came to the emergency room where initial x-rays  demonstrated possible free air.  She was taken to operating room because  of peritonitis.   FINDINGS:  The patient had an anterior 1-cm perforation at the  gastrojejunostomy.   OPERATION:  The patient was taken to the operating room and placed on  table in supine position.  After an adequate general endotracheal  anesthetic was administered, she was prepped and draped in usual sterile  manner exposing the midline of the abdomen.   A supraumbilical midline incision was made using a #15 blade and taken  down to the midline fascia.  It was at that level that we incised the  fascia with a 15 blade and grabbed the edges of the fascia with a Kocher  clamp.  We then found the posterior  sheath as we had gone through the  rectus muscle, opened that with a 15 blade, then grabbed both anterior  and posterior sheath with Kocher clamp, and bluntly dissected down to  the peritoneal cavity.   We were able to pass a pursestring suture of 0 Vicryl around the fascial  opening, then passed a Hasson cannula into the peritoneal cavity with  minimal difficulty.  We secured the Hasson cannula in place with the  pursestring suture which was in place.  We then insufflated carbon  dioxide gas up to maximal intra-abdominal pressure of 15 mmHg.   Once we were in the peritoneal cavity, we used the laparoscopic with  attached camera and light source in order to inspect the abdomen.   There were dilated loops of bowel what appeared to be but it turned out  that what thought to be dilated loop of small bowel was actually  markedly redundant sigmoid colon on the left side of the abdomen.  Part  of this redundant sigmoid colon had passed underneath the  gastrojejunostomy loop and presented on the right  side and we spent some  time figuring this out.   Upon inspecting the upper portion of the abdomen around the liver edge  and near the gastrojejunostomy, there was significant amount of  peritonitis and exudate which could not be accounted for by any ischemia  of the bowel or infection noted initially.  We ran the small bowel and  also we ran the sigmoid colon but ran the small bowel but could not find  any evidence of any evidence of an ischemic loop or bowel obstruction.  As we inspected the gastrojejunostomy well, we could see that there was  leakage of bilious contents from that area, indicating a perforated  ulcer.  Therefore, the decision was made to open.   We extended the supraumbilical incision up towards the xiphoid down  through the midline fascia.  Once we were in the peritoneal cavity we  had expecting gastrojejunostomy completely get around and showed that  the ulceration was at  the anterior portion of the gastrojejunostomy.  We  repaired this with an interrupted simple stitches of 2-0 silk tacking  the stomach down over the old.  We used the sutures that were used to  repair the hole in order to hold an omental patch which was brought out  from the transverse colon.  Once we repaired this, we inspected and  palpated in, a nasogastric tube to be just in the proximal portion of  the stomach.  We then irrigated with about 5-6 liters of saline  solution.  Once we had done this, we placed a Blake drain through the  right lower quadrant 5-mm cannula site which had been placed under  direct vision along with the left lower quadrant 5-mm cannula.  Those  were used to allow graspers in order to run the bowel during the  laparoscopic portion of the case.   The Blake drain was brought through the right lower quadrant drain  cannula site and brought out just anterior to the gastrojejunostomy  underneath the liver edge.  It was secured in place with 3-0 nylon.  Once this was done, we closed the fascia using a running looped #1 PDS  suture.  We irrigated with saline solution in subcu, then we closed the  subcu using a running subcuticular stitch of 4-0 Monocryl.  Dermabond,  Steri-Strips, and Tegaderm were applied.  The left lower quadrant 5-mm  site was closed with Tegaderm and Steri-Strips.      Cherylynn Ridges, M.D.  Electronically Signed     JOW/MEDQ  D:  03/25/2008  T:  03/25/2008  Job:  [id number]

## 2010-10-27 NOTE — Discharge Summary (Signed)
[name], [name]           ACCOUNT NO.:  [id number]   MEDICAL RECORD NO.:  [id number]          PATIENT TYPE:  IPS   LOCATION:  0504                          [facility]ACILITY[facility]  [facility]   PHYSICIAN:  Geoffery Lyons, M.D.      DATE OF BIRTH:  02/26/1951   DATE OF ADMISSION:  08/20/2005  DATE OF DISCHARGE:  08/24/2005                                 DISCHARGE SUMMARY   CHIEF COMPLAINT AND PRESENT ILLNESS:  This was the first admission to [facility] for this 60 year old married white female voluntarily  admitted.  History of depression.  Made statement to the husband that she  wanted to be with her deceased parents one week prior to this admission.  History of pneumonia, coughing, wheezing.  Husband took her to the emergency  department, drinking brandy, drinking most days, drinking on weekends,  starting in the morning.  On prednisone taper.  Made her feel depressed.  History of gastric bypass in 2004.  Lost 190 pounds.   PAST PSYCHIATRIC HISTORY:  First time at [facility].  No other  psychiatric treatment.  Saw a psychiatrist in 1987.   ALCOHOL/DRUG HISTORY:  As already stated, persistent use of alcohol.   MEDICAL HISTORY:  Essential tremors, pneumonia, bronchitis, gastric bypass  in January of 2004.   MEDICATIONS:  Ativan 1 mg twice a day, propranolol 20 mg twice a day,  Protonix 40 mg per day.   PHYSICAL EXAMINATION:  Performed and failed to show any acute findings.   LABORATORY DATA:  Blood chemistry with sodium 139, potassium 3.3, glucose  157.  Liver enzymes with SGOT 254, SGPT 108, total bilirubin 1.9, TSH 1.085.   MENTAL STATUS EXAM:  Alert, cooperative female.  Good eye contact.  Speech  normal rate, tempo and production.  Mood anxious.  Affect anxious.  Some  tremors.  Thought processes logical, coherent and relevant.  No delusions.  No active suicidal or homicidal ideation.  No hallucinations.  Cognition was  well-preserved.   ADMISSION DIAGNOSES:   AXIS I:  Alcohol dependence.  Depressive disorder not  otherwise specified.  Anxiety disorder not otherwise specified.  AXIS II:  No diagnosis.  AXIS III:  Essential tremors, pneumonia.  AXIS IV:  Moderate.  AXIS V:  GAF upon admission 35; highest GAF in the last year 65-70.   HOSPITAL COURSE:  She was admitted.  She was started in individual and group  psychotherapy.  She was detoxified with Librium.  Maintained on the  propranolol 20 mg three times a day, trazodone 50 mg at night, albuterol  inhaler 2 puffs every six hours as needed, Tussionex 5 cc every 12 hours as  needed.  Potassium was replaced.  She endorsed that she had been drinking  more than she realized.  Endorsed started drinking early in the morning,  liquor, and then she was using Ativan on top of it.  Increased tolerance.  She likes her job at [employer].  Endorsed good relationship with the  husband and the son.  Strong family history of alcohol dependency.  Endorsed  that she had tried to drink only  the one drink but in no time it was out of  control.  Family session with the husband went well.  As she is being  detoxed, evidenced more shaking and tremors, as she was going down the  Librium.  By August 23, 2005, she endorsed that she was getting better.  Dealing with the tremors.  Endorsed she was very committed to abstinence.  Getting used to the idea that she is an alcoholic.  Aware of the effect of  alcohol on her liver.  Endorsed that this was going to make it even more  __________ for her to quit drinking as she realized the effect that it was  having on her liver.  By August 24, 2005, she was in full contact with  reality.  No suicidal or homicidal ideation.  No hallucinations.  No  delusions.  Fully detoxed.  Endorsed she was feeling better.  Clear-headed.  Still some tremulousness but better.  Sleep was improved.  She was willing  and motivated to pursue further outpatient treatment.  Was going to come to  the [facility] program.   DISCHARGE DIAGNOSES:  AXIS I:  Alcohol dependence.  Benzodiazepine abuse.  Depressive disorder not otherwise specified.  AXIS II:  No diagnosis.  AXIS III:  Essential tremors, status post pneumonia.  AXIS IV:  Moderate.  AXIS V:  GAF upon discharge 55.   DISCHARGE MEDICATIONS:  1.  Maxzide 37.5/25 mg per day.  2.  Skelaxin 800 mg three times a day.  3.  Protonix 40 mg per day.  4.  K-Dur 20 mEq twice a day.  5.  Antabuse 250 mg per day.  6.  Inderal 20 mg three times a day.  7.  Trazodone 50 mg at night.  8.  Albuterol 2 puffs every six hours as needed.  9.  Ventolin 2.5 solution every six hours.  10. Atrovent 0.5/2.5 1 q.6h.  11. Tussionex 5 cc every 12 hours as needed.  12. Vicodin 5/500 mg, 1-2 every six hours as needed in the next several      days.  13. Seroquel 25 mg every four hours as needed.   FOLLOW UP:  [facility] IOP program.      Geoffery Lyons, M.D.  Electronically Signed     IL/MEDQ  D:  09/10/2005  T:  09/11/2005  Job:  [id number]

## 2010-10-27 NOTE — Op Note (Signed)
NAME:  [name], [name]                     ACCOUNT NO.:  [id number]   MEDICAL RECORD NO.:  [id number]                   PATIENT TYPE:  AMB   LOCATION:  ENDO                                 FACILITY:  [facility]   PHYSICIAN:  Anselmo Rod, M.D.               DATE OF BIRTH:  April 08, 1951   DATE OF PROCEDURE:  11/24/2003  DATE OF DISCHARGE:                                 OPERATIVE REPORT   PROCEDURE PERFORMED:  Screening colonoscopy.   ENDOSCOPIST:  Anselmo Rod, M.D.   INSTRUMENT USED:  Olympus videocolonoscope.   INDICATION FOR THE PROCEDURE:  A 60 year old white female status post  gastric bypass surgery in the past undergoing a screening colonoscopy to  rule out colonic polyps, masses, etc.   PREPROCEDURE PREPARATION:  Informed consent was procured from the patient.  The patient was fasted for eight hours prior to the procedure and prepped  with a bottle of MiraLax and Gatorade the night prior to the procedure.   PREPROCEDURE PHYSICAL:  VITAL SIGNS:  Stable.  NECK:  Supple.  CHEST:  Clear to auscultation.  S1 and S2 regular.  ABDOMEN:  Soft with normal bowel sounds.   DESCRIPTION OF THE PROCEDURE:  The patient was placed in the left lateral  decubitus position and sedated with 75 mg of Demerol and 10 mg of Versed  intravenously.  Once the patient was adequately sedated and maintained on  low-flow oxygen and continuous cardiac monitoring, the Olympus  videocolonoscope was advanced from the rectum to the cecum with slight  difficulty because of the large amount of residual stool inguinal hernia  colon.  Multiple washes were done.  The appendiceal orifice and ileocecal  valve were visualized and photographed.  No masses polyps, erosions,  ulcerations,or diverticula were seen.  Small internal hemorrhoids were  appreciated on retroflexion in the rectum.  The patient tolerated the  procedure well without immediate complications.  Small lesions could have  been missed secondary  to a relatively poor prep.   IMPRESSION:  1. Unrevealing colonoscopy to the cecum except for small internal     hemorrhoids.  2. Some residual stool in the colon.  Small lesions could have been missed.   RECOMMENDATIONS:  1. A repeat colonoscopy has been recommended in the next five years unless     the patient develops any abdominal symptoms in the interim.  2. Outpatient followup as the need arises in the future.                                               Anselmo Rod, M.D.    JNM/MEDQ  D:  11/24/2003  T:  11/24/2003  Job:  [id number]   cc:   Wyline Copas, P.A.-C  [facility]   Talmadge Coventry, M.D.  [phone]  [address]  [place]  [place] [address]  Fax: [phone]   Willa Rough, M.D.   Franki Cabot, M.D.  [place]

## 2010-10-27 NOTE — Op Note (Signed)
[name], [name]           ACCOUNT NO.:  [id number]   MEDICAL RECORD NO.:  [id number]          PATIENT TYPE:  OIB   LOCATION:  2899                         FACILITY:  [facility]   PHYSICIAN:  Doylene Canning. Ladona Ridgel, M.D.  DATE OF BIRTH:  Aug 08, 1950   DATE OF PROCEDURE:  07/11/2004  DATE OF DISCHARGE:                                 OPERATIVE REPORT   PROCEDURE PERFORMED:  Electrophysiology study and radiofrequency catheter  ablation of a concealed left lateral accessory pathway resulting in easily-  inducible AV reentrant tachycardia.   INTRODUCTION:  The patient is a 60 year old woman with a long history of  tachypalpitations.  She underwent electrophysiologic study and catheter  ablation approximately two years ago.  At that time she was found to have a  poorly-conducting SVT with an accessory pathway mapped to the right superior  free wall.  This was approximately 11 or 12 o'clock on the tricuspid valve  annulus.  Following catheter ablation, there was no inducible SVT; however,  mapping was somewhat difficult because of the nonsustained nature of the  patient's SVT.  She initially did well for several months but then developed  recurrent tachypalpitations with documented SVT at rates of nearly 200 beats  per minute.  She is now referred for repeat catheter ablation.   PROCEDURE:  After informed consent was obtained, the patient was taken to  the diagnostic EP lab in the fasted state.  After the usual prepping and  draping, intravenous fentanyl and midazolam were given for sedation.  A 6  French hexapolar catheter was inserted percutaneously into the right jugular  vein and advanced to the coronary sinus.  A 5 French quadripolar catheter  was inserted percutaneously in the left femoral vein and advanced to the RV  apex.  A 5 French quadripolar catheter was inserted percutaneously in the  left femoral vein and advanced to the His bundle region, and a 5 [place]  quadripolar catheter was  inserted percutaneously in the left femoral vein  and advanced to the right atrium.  During catheter manipulation, the patient  had spontaneously-occurring SVT at a cycle length of 306 msec.  Mapping  demonstrated the earliest atrial activation to be in the lateral portion of  the coronary sinus.  This was in contrast to the patient's previous  accessory pathway conduction at the time of her initial ablation.  At this  point during tachycardia, PVCs were placed at the time of His bundle  refractoriness, demonstrating only minimal pre-excitation of the atria.  Ventricular pacing during tachycardia demonstrated a clear-cut VAV  conduction sequence.  Mapping demonstrated the earliest atrial activation to  be in the left lateral portion of the mitral valve annulus.  At this point,  the ablation catheter was inserted by way of the right femoral artery and  advanced retrograde across the aortic valve into the left ventricle.  The  ablation catheter was then flipped into the left atrium and mapping was  carried out along the mitral valve annulus.  Two RF energy applications were  then delivered.  During the second RF energy application as the catheter was  being  withdrawn toward the ventricular insertion of the mitral annulus,  tachycardia spontaneously terminated.  RF energy application was continued,  as catheter position was quite stable, for a total of 60 seconds.  During  this time, pacing was carried out from the right ventricle and there was  clear VA dissociation which had not been present prior to ablation.  A bonus  RF energy application was then delivered and the patient was observed for  approximately 40 minutes.  During this time, rapid atrial pacing was carried  out from the coronary sinus as well as the high right atrium at pacing cycle  lengths of 600 msec and stepwise decreased down to 310 msec, where AV  Wenckebach was observed.  During rapid atrial pacing, the PR interval was   equal to but not greater than the RR interval, and there was no SVT.  Programmed atrial stimulation was also carried out from the coronary sinus  as well as the high right atrium at basic drive cycle lengths of 161 and 400  msec.  The S1-S2 interval was stepwise decreased from 440 msec down to 230  msec, where atrial refractoriness was observed.  During programmed atrial  stimulation, there were no AH jumps, no echo beats and no inducible SVT.  Prior to catheter ablation, ventricular pacing was carried out at a pacing  cycle length of 500 msec, demonstrating eccentric atrial activation.  It  should be noted that during ventricular pacing, the patient would  spontaneously go into SVT.  It should also be noted that following ablation,  there was VA dissociation.  At this point the catheters were removed,  hemostasis was assured, and the patient was returned to her room in  satisfactory condition.   COMPLICATIONS:  There were no immediate procedural complications.   RESULTS:  A.  BASELINE ELECTROCARDIOGRAM:  The baseline ECG demonstrates normal sinus  rhythm with normal axes and intervals.   B.  BASELINE INTERVALS:  The sinus node cycle length was 824 msec.  The PR  interval was 147 msec, the QRS duration was 97 msec, the HV interval 49  msec, and the AH interval 74 msec.   C.  RAPID VENTRICULAR PACING:  Prior to ablation, rapid ventricular pacing  was carried out from the RV apex, demonstrating eccentric atrial activation.  During ventricular pacing at a cycle length of 500 msec, there was easily  inducible SVT.  Following ablation, there was VA dissociation during rapid  ventricular pacing.   D.  PROGRAMMED VENTRICULAR STIMULATION:  Programmed ventricular stimulation  was not carried out following ablation secondary to the VA dissociation.  Programmed ventricular stimulation was not carried out prior to ablation secondary to the incessant nature of the patient's SVT, making any   ventricular pacing result in the initiation of the patient's SVT.   E.  PROGRAMMED ATRIAL STIMULATION:  Programmed atrial stimulation was  carried out from the coronary sinus as well as the high right atrium at  basic drive cycle lengths of 096 and 400 msec.  The S1-S2 interval was  stepwise decreased down to 230 msec, where atrial refractoriness was  observed.  During programmed atrial stimulation, there were no AH jumps and  no echo beats.  There was no inducible SVT following catheter ablation.   F.  RAPID ATRIAL PACING:  Rapid atrial pacing was carried out from the  coronary sinus as well as the high right atrium at pacing cycle lengths of  600 msec and stepwise decreased down to 310  msec, resulting in AV  Wenckebach.  Again, rapid atrial pacing was not carried out prior to  ablation secondary to the incessant nature of the patient's SVT.   G.  ARRHYTHMIAS OBSERVED:  1.  AV reentrant tachycardia.  Initiation:  Spontaneous and with rapid      ventricular pacing.  Duration was sustained.  Termination was with      catheter ablation and spontaneous.  The cycle length was 306 msec.  2.  Atrial fibrillation.  Initiation was spontaneous following termination      of SVT.  Duration was sustained.  Termination was spontaneous.   H.  MAPPING:  Mapping of the patient's left lateral accessory pathway  demonstrated the earliest atrial activation at approximately 4 o'clock on  the mitral valve annulus when viewed in the LAO projection.   1.  RADIOFREQUENCY ENERGY APPLICATION:  A total of three RF energy      applications including one bonus RF application were delivered.  During      the second RF energy application, accessory pathway conduction and SVT      terminated.  Following this, the patient was observed for 40 minutes and      had no residual accessory pathway conduction.   CONCLUSION:  This study demonstrates successful electrophysiologic study and  radiofrequency catheter ablation  of AV reentrant tachycardia with a total of  three radiofrequency energy applications delivered to the accessory  pathway, rendering it noninducible.  It also demonstrates spontaneous-  occurring atrial fibrillation, which almost certainly occurred as a  consequence of the patient's induction of supraventricular tachycardia.  It  should be noted that following ablation, atrial fibrillation was not  inducible.      GWT/MEDQ  D:  07/11/2004  T:  07/11/2004  Job:  [id number]   cc:   Willa Rough, M.D.   Talmadge Coventry, M.D.  [address]  [place]  [place] [address]  Fax: [phone]

## 2010-10-27 NOTE — Discharge Summary (Signed)
[name], [name]           ACCOUNT NO.:  [id number]   MEDICAL RECORD NO.:  [id number]          PATIENT TYPE:  INP   LOCATION:  5522                         FACILITY:  [facility]   PHYSICIAN:  Angelia Mould. Derrell Lolling, M.D.DATE OF BIRTH:  04-28-1951   DATE OF ADMISSION:  03/24/2008  DATE OF DISCHARGE:  04/02/2008                               DISCHARGE SUMMARY   DISCHARGING PHYSICIAN:  Angelia Mould. Derrell Lolling, MD   PROCEDURES:  On March 25, 2008, a diagnostic laparoscopy which turned  into an exploratory laparotomy with repair of gastrojejunostomy ulcer  with an omental patch, was completed by Dr. Lindie Spruce.   There were no consultants.   REASON FOR ADMISSION:  [name] is a 60 year old female who is  status post gastric bypass surgery who presented to the emergency  department with an acute onset of abdominal pain without any nausea or  vomiting.  At this time, she was complaining of severe mid abdominal  pain.  A 3-way abdominal x-ray was taken which was negative and did not  show any free air.  At this time, her white blood cell count was 16,500.  Apparently, a CT scan of the abdomen and pelvis was ordered; however,  due to the patient's severity of pain, it was felt that this CT scan  could be bypassed and the patient was taken straight to the operating  room.  Please see admitting history and physical for further details.   ADMITTING DIAGNOSES:  1. Abdominal pain.  2. Status post laparoscopic gastric bypass with gastrojejunostomy.  3. Leukocytosis.   HOSPITAL COURSE:  At this time, the patient was admitted and started on  cefoxitin.  At this time, she was taken to the operating room where a  diagnostic laparoscopy, turned into exploratory laparotomy with repair  of gastrojejunostomy(marginal) ulcer with an omental patch was  performed.  The patient tolerated the procedure fairly well; however,  was transferred to a telemetry unit postoperatively.  By postoperative  day one half,  the patient was complaining of inadequate pain control and  therefore was placed on a full-dose PCA.  Otherwise, at this time her  abdomen was soft and flat with no bowel sounds and her NG tube had  bloody drainage out of it.  She also had a JP drain placed which also  had a large volume of serosanguineous output.  By postoperative day 1,  the patient's pain was much better controlled.  She was continuing to  not pass any flatus, and therefore, she was kept on bowel rest with an  NG tube for the next several days due to a postoperative ileus.  By  postoperative day 4, the patient had began to pass flatus and was  beginning to have bowel sounds.  Therefore at this time, her NG tube was  clamped and a Gastrografin swallow study was ordered for the following  day to make sure there was no leak.  On postoperative day 5, this was  performed and no leak was seen and therefore, at this time the patient  was advanced with clear liquid diet.  Over the next several days, the  patient tolerated her diet and this was advanced to a regular diet.  During this time, her PCA was discontinued and she was started on p.o.  pain medicines as well.  By postoperative day 8, the patient was  tolerating a regular diet as well as her p.o. pain medicine.  She was  having flatus as well as small bowel movement.  At this time, her  abdomen was soft, still slightly tender with active bowel sounds.  At  this time, her JP drain was continued.  Otherwise, the patient was  stable for discharge home.   DISCHARGE DIAGNOSES:  1. Perforated ulcer at the gastrojejunostomy junction.  2. Status post exploratory laparotomy with gastrojejunostomy and also      repair with an omental patch.  3. Anxiety disorder.  4. Postoperative ileus which has resolved.   DISCHARGE MEDICATIONS:  1. Celebrex 200 mg b.i.d. was discontinued.  2. Cymbalta 60 mg daily.  3. KCl 10 mEq b.i.d.  4. Skelaxin 800 mg t.i.d.  5. Lorazepam 2 mg  t.i.d.  6. Triamterene hydrochlorothiazide 75/50 daily.  7. Benzonatate 100 mg p.r.n.  8. Trazodone 150 mg nightly.  9. Polysaccharide-iron 150 mg daily.  10.Carafate 2 teaspoons q.i.d.  11.Prevacid 30 mg daily.  12.Vitamin D 50,000 International Units, special dosing.   She was also given a prescription for Percocet 5/325 one to two tablets  q.4 h. p.r.n. pain.  She was also informed that she may take a stool  softener as needed for constipation.   DISCHARGE INSTRUCTIONS:  [name] was informed that she did not  have any dietary restrictions, other than her usual bariatric diet. She  may increase her activity slowly, and she may walk up steps.  At this  time, she was informed that she may not shower or bathe until her JP  drain is removed.  Otherwise, she is not to do any heavy lifting for the  next 6 weeks and she is not to drive for the next 2 weeks.  She is to  call our office for a fever greater than 101.5, worsening abdominal  pain, or problems with her JP drain.  Otherwise, she is to return to see  Dr. Lindie Spruce in 1 week for postoperative evaluation of the JP drain.      Letha Cape, PA      [place]. Derrell Lolling, M.D.  Electronically Signed    KEO/MEDQ  D:  05/13/2008  T:  05/13/2008  Job:  [id number]   cc:   Dr. Lindie Spruce

## 2010-10-27 NOTE — Discharge Summary (Signed)
[name], [name]                     ACCOUNT NO.:  [id number]   MEDICAL RECORD NO.:  [id number]                   PATIENT TYPE:  OIB   LOCATION:  4705                                 FACILITY:  [facility]   PHYSICIAN:  Doylene Canning. Ladona Ridgel, M.D.               DATE OF BIRTH:  July 15, 1950   DATE OF ADMISSION:  08/20/2003  DATE OF DISCHARGE:  08/21/2003                                 DISCHARGE SUMMARY   DISCHARGE DIAGNOSES:  1. Supraventricular tachycardia.     a. Status post radiofrequency catheter ablation.  2. Obesity.  3. Hypertension.   PROCEDURE PERFORMED THIS ADMISSION:  EP study and radiofrequency ablation of  SVT by Doylene Canning. Ladona Ridgel, M.D., on August 20, 2003.   HOSPITAL COURSE:  This 60 year old female patient of Dr. Ladona Ridgel with a  history of SVT and WPW syndrome presented to [facility] for  ablation of her SVT.  This was performed on August 20, 2003, by Dr. Ladona Ridgel.  Please see his dictated note for complete details.  The patient did have a  17-beat run of SVT on the evening of August 20, 2003.  Potassium was low, and  this was repleted.  She had been on diuretics at home prior to admission,  and these were discontinued by Dr. Eden Emms on the morning of August 21, 2003.  Dr. Ladona Ridgel had added Toprol 50 mg a day.  Dr. Eden Emms decreased the dose to  25 mg a day.  The patient will need follow-up with Dr. Ladona Ridgel in the next  two to four weeks.   LABORATORY DATA:  White count 4800, platelet count 163,000, hemoglobin 12.4,  hematocrit 38.4.  Sodium 145, potassium 3.4, chloride 108, CO2 29, glucose  105, BUN 14, creatinine 0.7, calcium 8.9.   DISCHARGE MEDICATIONS:  1. Toprol XL 25 mg daily.  2. She has been advised to stop taking her HCTZ.  3. She has been advised that she will need antibiotics before any bowel,     bladder, dental, or GYN procedure for the next three months.   ACTIVITY:  No heavy lifting or strenuous activity for the next four days.  No driving for two  days.   DIET:  Low fat, low cholesterol, low sodium.   WOUND CARE:  The patient should call for any drainage or swelling.   FOLLOW-UP:  With Dr. Ladona Ridgel on April 28 at  3 p.m.      Tereso Newcomer, P.A.                        Doylene Canning. Ladona Ridgel, M.D.    SW/MEDQ  D:  08/21/2003  T:  08/21/2003  Job:  [id number]   cc:   Talmadge Coventry, M.D.  [address]  [place]  [place] [address]  Fax: [phone]

## 2010-10-27 NOTE — Op Note (Signed)
[name], LEITCH                     ACCOUNT NO.:  [id number]   MEDICAL RECORD NO.:  [id number]                   PATIENT TYPE:  OIB   LOCATION:  4705                                 FACILITY:  [facility]   PHYSICIAN:  Doylene Canning. Ladona Ridgel, M.D.               DATE OF BIRTH:  [birth year]   DATE OF PROCEDURE:  08/20/2003  DATE OF DISCHARGE:  08/21/2003                                 OPERATIVE REPORT   PROCEDURE PERFORMED:  Invasive electrophysiologic study with intracardiac  mapping, induction of arrhythmia with programmed stimulation, and catheter  ablation.   INTRODUCTION:  The patient is a 60 year old woman with a history of  recurrent SVT and documented WPW syndrome.  She has recurrent  tachypalpitations for years, which had been fairly well-controlled with  quinidine therapy.  She is now referred for electrophysiologic study and  catheter ablation as she desires not to be on lifelong antiarrhythmic drug  therapy.   PROCEDURE:  After informed consent was obtained, the patient was taken to  the diagnostic EP lab in the fasting state.  After the usual preparation and  draping, intravenous fentanyl and midazolam were given for sedation.  A 6  French hexapolar catheter was inserted percutaneously into the right jugular  vein and advanced to the coronary sinus.  A 5 French quadripolar catheter  was inserted percutaneously in the right femoral vein and advanced to the RV  apex.  A 5 French quadripolar catheter was inserted percutaneously in the  right femoral vein and advanced to the His bundle region.  A 7 French 20-  pole halo catheter was inserted percutaneously in the right femoral vein and  advanced to the right atrium.  After measurement of the basic intervals,  rapid ventricular pacing was carried out from the RV apex at a pacing cycle  length of 600 msec and stepwise decreased down to 500 msec, where a VA  dissociation was observed.  Next programmed ventricular stimulation was  carried out from the RV apex at a basic drive cycle length of 782 msec,  demonstrating VA dissociation.  Next rapid atrial pacing was carried out  from the coronary sinus as well as the high right atrium at pacing cycle  lengths of 490 msec and stepwise decreased down to 290 msec, where AV  Wenckebach was observed.  During rapid atrial pacing the PR interval was  less than the RR interval.  Next programmed atrial stimulation was carried  out from the coronary sinus as well as the high right atrium at basic drive  cycle lengths of 956 msec.  The S1-S2 interval was stepwise decreased from  540 msec down to 260 msec, where atrial refractoriness was observed.  During  programmed atrial stimulation there was initially no AH jump and no echo  beats.  Next isoproterenol was infused at a rate of 2 mcg/min.  Rapid  ventricular pacing was then carried out from the RV apex, resulting in  the  initiation of SVT.  This SVT demonstrated eccentric atrial activation with  the earliest atrial activation at approximately 12 o'clock on the tricuspid  valve annulus.  The tachycardia was typically nonsustained and terminated  spontaneously.  PVCs placed at the time of His bundle refractoriness did not  demonstrate clear atrial pre-excitation.  The VA time, however, was not  short despite the short RP tachycardia.  The tachycardia was reproducibly  inducible with rapid ventricular pacing on isoproterenol, but it was always  sustained for only approximately 15 or 20 seconds before terminating  spontaneously.  During tachycardia, however, ventricular pacing demonstrated  a VAV conduction  sequence.  As noted, mapping was carried out in  tachycardia and the earliest atrial activation occurred at sites along the  superior roof of the tricuspid valve annulus.  The 7 French quadripolar  ablation catheter was then maneuvered into this site and five RF energy  applications were delivered on the atrial insertion of the  tricuspid valve  annulus.  Following this isoproterenol was again infused and additional  rapid ventricular pacing carried out.  However, this time tachycardia could  not be initiated.  The patient was observed for 30 minutes and had no  recurrent SVT.  Following catheter ablation additional rapid atrial, rapid  ventricular, and programmed atrial and ventricular stimulation were carried  out, demonstrating no inducible tachycardia.  The patient did, however, have  nonsustained atrial flutter, which was not thought to be her clinical  arrhythmia and was not targeted for catheter ablation.  The catheters were  then removed, hemostasis was assured, and the patient returned to her room  in satisfactory condition.   COMPLICATIONS:  There were no immediate procedural complications.   RESULTS:  A.  BASELINE ELECTROCARDIOGRAM:  The baseline ECG demonstrated  normal sinus rhythm with normal axes and intervals.  There was no pre-  excitation at baseline.  There was sinus bradycardia present.   B.  BASELINE INTERVALS:  The sinus node cycle length was 744 msec.  The PR  interval 155 msec.  The QRS duration 78 msec.  The HV interval 55 msec.   C.  RAPID VENTRICULAR PACING:  Rapid ventricular pacing at baseline  demonstrated VA dissociation at 500 msec.  Following isoproterenol infusion,  rapid ventricular pacing resulted in the initiation of SVT.   D.  PROGRAMMED VENTRICULAR STIMULATION:  Programmed ventricular stimulation  was carried out from the RV apex at basic drive cycle lengths of 161 msec.  The S1-S2 interval was stepwise decreased down to 250 msec during  isoproterenol infusion, resulting in ventricular refractoriness.  During  programmed ventricular stimulation the atrial activation sequence appeared  to be midline although it was somewhat difficult to tell if the earliest  activation was not, in fact, near the roof of the tricuspid valve annulus.  E.  RAPID ATRIAL PACING:  Rapid atrial  pacing was carried out from both the  coronary sinus as well as the high right atrium at pacing cycle lengths of  490 msec and stepwise decreased down to 290 msec, where AV Wenckebach was  observed.  During rapid atrial pacing the PR interval was less than the RR  interval and there was no inducible SVT.   F.  PROGRAMMED ATRIAL STIMULATION:  Programmed atrial stimulation was  carried out from the coronary sinus as well as the high right atrium at  basic drive cycle lengths of 096 msec.  The S1-S2 interval was stepwise  decreased from 540 msec down  to 260 msec, resulting in atrial  refractoriness.  During programmed atrial stimulation there were no AH jumps  and no echo beats.   G.  ARRHYTHMIAS OBSERVED:  AV reentrant tachycardia.  Initiation:  Rapid  ventricular pacing on isoproterenol.  Duration was nonsustained.  Termination was spontaneous.   H.  MAPPING:  Mapping of the patient's tachycardia demonstrated the earliest  atrial activation high in the roof of the tricuspid valve annulus at 12  o'clock position.   A. RADIOFREQUENCY ENERGY APPLICATION:  A total of five RF energy     applications were delivered to the atrial insertion on the roof of the     tricuspid valve annulus.  Following catheter ablation there was no     inducible SVT noted.   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  Following catheter  ablation there were no inducible arrhythmias.                                               Doylene Canning. Ladona Ridgel, M.D.    GWT/MEDQ  D:  09/21/2003  T:  09/22/2003  Job:  [id number]   cc:   Angelena Sole, M.D. [facility]

## 2010-10-27 NOTE — Discharge Summary (Signed)
NAMESANDI, Cox           ACCOUNT NO.:  [id number]   MEDICAL RECORD NO.:  [id number]          PATIENT TYPE:  OIB   LOCATION:  2028                         FACILITY:  [facility]   PHYSICIAN:  Doylene Canning. Ladona Ridgel, M.D.  DATE OF BIRTH:  1950/06/19   DATE OF ADMISSION:  07/11/2004  DATE OF DISCHARGE:  07/12/2004                                 DISCHARGE SUMMARY   DISCHARGE DIAGNOSES:  1.  Discharging day #1, status post electrophysiology study with inducible      accessory pathway, left lateral mitral valve annulus.  2.  Atrial fibrillation following supraventricular tachycardia termination,      not reinducible.  3.  History of supraventricular tachycardia with prior radiofrequency      catheter ablation in the right atrium, March 2005.  4.  Long-term history of symptomatic supraventricular tachycardia, failing      medical therapy.   SECONDARY DIAGNOSIS:  Status post gastric bypass laparoscopic procedure 2  years ago.   PROCEDURE:  July 11, 2004, electrophysiology study and radiofrequency  catheter ablation of a concealed left lateral accessory pathway resulting in  easily inducible atrioventricular reentrant tachycardia.  Dr. Doylene Canning.  Ladona Ridgel is the practitioner.  The accessory pathway is approximately 4  o'clock on the mitral valve annulus when viewed in the left anterior oblique  projection.   DISCHARGE DISPOSITION:  [name] is ready for discharge, postprocedure  day #1.  Following the procedures on July 11, 2004, the patient did have  mild expression of frank blood from the right groin without formation of  deep tissue hematoma.  Her hemorrhage was stopped with external pressure  applied for a half hour.  She has had no further swelling, there is no bruit  at the right groin and no tautness or pain.  The patient has been afebrile  this hospitalization and has maintained sinus rhythm ever since coming back  from the electrophysiology lab.   DISCHARGE MEDICATIONS:   The patient discharges on the following medications:  1.  Inderal 10 mg to take as needed for supraventricular tachycardia.  2.  She also takes Citrucel twice daily.  3.  She is to continue on her supplements which include folic acid and      biotin.  4.  If she plans a dental procedure in the next 6 months, she is to call [phone] for antibiotic coverage.  5.  Pain medication was given to the patient; she has had back pain from      lying in one place; it will be Vicodin 5/500 mg 1-2 tabs every 4-6      hours.   ACTIVITY:  She is asked not to drive for the next 2 days and she may restart  walking Saturday, July 15, 2004.  She is to avoid straining or heavy  lifting for the next 2 weeks.  She is to shower.   SPECIAL DISCHARGE INSTRUCTIONS:  She is to call [phone] if she experiences  pain, swelling or discharge at her catheterization sites in both groins.   FOLLOWUP:  She is to follow up with Dr.  Ladona Ridgel, Friday, August 18, 2004, at  11:45 in the morning.   BRIEF HISTORY:  [name] is a 60 year old female.  She has a history  of recurrent supraventricular tachycardia which goes back many years, at  least 22 she relates to [place].  She underwent electrophysiology study with  catheter ablation back in March of 2005 and at that time, her  supraventricular tachycardia seemed very difficult to initiate.  She was  mapped in the high right anterior portion of the right atrium along the  tricuspid valve and at that point, she underwent catheter ablation and had  no recurrent supraventricular tachycardia.  She returns on June 15, 2004  for followup, noting over the past 2 months, especially at the Christmas  season, she has had increasingly frequent episodes of tachy-palpitations;  these started in early December and had been absent actually since the March  radiofrequency catheter ablation.  She has recent worn a Holter monitor  which demonstrated recurrent episodes of narrow QRS  tachycardia at 200 beats  a minute.  She returns today for followup.  The risks and benefits and  expectations of repeat catheter ablation were discussed with the patient.  The patient will present for electrophysiology study and possible  radiofrequency catheter ablation of remaining focus of supraventricular  tachycardia.   HOSPITAL COURSE:  Hospital course is as described in discharge disposition,  the patient admitted on an elective basis, July 11, 2004.  She underwent  successful radiofrequency catheter ablation of the left atrial SVT focus at  about 4 o'clock on the mitral valve annulus from the LAO projection and has  had no inducible recurrence, and the patient has had no post-procedure  complications except for 1 episode of hemorrhage at the right groin which  was controlled with external pressure and no evidence of hematoma formation.      GM/MEDQ  D:  07/12/2004  T:  07/12/2004  Job:  [id number]   cc:   Talmadge Coventry, M.D.  [address]  [place]  [place] [address]  Fax: [phone]
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
## 2010-10-31 ENCOUNTER — Ambulatory Visit (HOSPITAL_COMMUNITY): Payer: BC Managed Care – PPO

## 2010-11-07 ENCOUNTER — Ambulatory Visit (HOSPITAL_COMMUNITY): Payer: BC Managed Care – PPO | Attending: Family Medicine

## 2010-11-07 DIAGNOSIS — M810 Age-related osteoporosis without current pathological fracture: Secondary | ICD-10-CM | POA: Insufficient documentation

## 2011-03-12 LAB — CBC
HCT: 29.7 — ABNORMAL LOW
HCT: 31.7 — ABNORMAL LOW
HCT: 32.1 — ABNORMAL LOW
HCT: 33 — ABNORMAL LOW
Hemoglobin: 10.1 — ABNORMAL LOW
Hemoglobin: 10.9 — ABNORMAL LOW
MCHC: 31.2
MCV: 87
MCV: 87.2
MCV: 89.4
Platelets: 307
Platelets: 389
RBC: 3.32 — ABNORMAL LOW
RBC: 3.69 — ABNORMAL LOW
RDW: 15.2
WBC: 12.5 — ABNORMAL HIGH
WBC: 16.5 — ABNORMAL HIGH
WBC: 9
WBC: 9.4

## 2011-03-12 LAB — BASIC METABOLIC PANEL
BUN: <1 — ABNORMAL LOW
CO2: 30
CO2: 31
Calcium: 7.9 — ABNORMAL LOW
Chloride: 105
Chloride: 105
Chloride: 105
Creatinine, Ser: 0.56
Creatinine, Ser: 0.74
GFR calc Af Amer: >60
GFR calc non Af Amer: >60
GFR calc non Af Amer: >60
Glucose, Bld: 120 — ABNORMAL HIGH
Glucose, Bld: 130 — ABNORMAL HIGH
Potassium: 3.2 — ABNORMAL LOW
Potassium: 3.4 — ABNORMAL LOW
Potassium: 3.7
Sodium: 140
Sodium: 142

## 2011-03-12 LAB — COMPREHENSIVE METABOLIC PANEL
Alkaline Phosphatase: 77
BUN: 15
CO2: 26
Chloride: 103
Creatinine, Ser: 0.61
GFR calc non Af Amer: >60
Glucose, Bld: 181 — ABNORMAL HIGH
Potassium: 2.9 — ABNORMAL LOW
Total Bilirubin: 0.7

## 2011-03-12 LAB — DIFFERENTIAL
Basophils Absolute: 0
Basophils Absolute: 0.1
Basophils Relative: 0
Basophils Relative: 1
Eosinophils Absolute: 0.1
Eosinophils Relative: 2
Lymphocytes Relative: 3 — ABNORMAL LOW
Lymphocytes Relative: 6 — ABNORMAL LOW
Lymphs Abs: 0.7
Lymphs Abs: 0.8
Monocytes Absolute: 0.5
Monocytes Absolute: 0.6
Monocytes Absolute: 0.6
Monocytes Relative: 6
Neutro Abs: 11 — ABNORMAL HIGH
Neutro Abs: 15.4 — ABNORMAL HIGH
Neutrophils Relative %: 93 — ABNORMAL HIGH

## 2011-03-12 LAB — LIPASE, BLOOD: Lipase: 39

## 2012-02-18 ENCOUNTER — Other Ambulatory Visit: Payer: Self-pay | Admitting: Family Medicine

## 2012-02-21 ENCOUNTER — Other Ambulatory Visit (HOSPITAL_COMMUNITY): Payer: Self-pay | Admitting: *Deleted

## 2012-02-27 ENCOUNTER — Encounter (HOSPITAL_COMMUNITY)
Admission: RE | Admit: 2012-02-27 | Discharge: 2012-02-27 | Disposition: A | Discharge status: Home / Self Care | Payer: BC Managed Care – PPO | Source: Ambulatory Visit | Attending: Family Medicine | Admitting: Family Medicine

## 2012-02-27 DIAGNOSIS — M810 Age-related osteoporosis without current pathological fracture: Secondary | ICD-10-CM | POA: Insufficient documentation

## 2012-02-27 MED ORDER — SODIUM CHLORIDE 0.9 % IV SOLN
Freq: Once | INTRAVENOUS | Status: AC
  Administered 2012-02-27: 14:00:00 via INTRAVENOUS

## 2012-02-27 MED ORDER — ZOLEDRONIC ACID 5 MG/100ML IV SOLN
5.0000 mg | Freq: Once | INTRAVENOUS | Status: AC
  Administered 2012-02-27: 5 mg via INTRAVENOUS
  Filled 2012-02-27: qty 100

## 2012-12-25 ENCOUNTER — Other Ambulatory Visit (HOSPITAL_COMMUNITY)
Admission: RE | Admit: 2012-12-25 | Discharge: 2012-12-25 | Disposition: A | Discharge status: Home / Self Care | Payer: BC Managed Care – PPO | Source: Ambulatory Visit | Attending: Family Medicine | Admitting: Family Medicine

## 2012-12-25 ENCOUNTER — Other Ambulatory Visit: Payer: Self-pay | Admitting: Family Medicine

## 2012-12-25 DIAGNOSIS — Z0000 Encounter for general adult medical examination without abnormal findings: Secondary | ICD-10-CM | POA: Insufficient documentation

## 2013-02-10 ENCOUNTER — Other Ambulatory Visit: Payer: Self-pay | Admitting: Family Medicine

## 2013-04-15 ENCOUNTER — Encounter (HOSPITAL_COMMUNITY): Payer: BC Managed Care – PPO

## 2013-04-24 ENCOUNTER — Other Ambulatory Visit (HOSPITAL_COMMUNITY): Payer: Self-pay | Admitting: Family Medicine

## 2013-04-24 ENCOUNTER — Ambulatory Visit (HOSPITAL_COMMUNITY)
Admission: RE | Admit: 2013-04-24 | Discharge: 2013-04-24 | Disposition: A | Discharge status: Home / Self Care | Payer: BC Managed Care – PPO | Source: Ambulatory Visit | Attending: Family Medicine | Admitting: Family Medicine

## 2013-04-24 ENCOUNTER — Encounter (HOSPITAL_COMMUNITY): Payer: Self-pay

## 2013-04-24 DIAGNOSIS — M810 Age-related osteoporosis without current pathological fracture: Secondary | ICD-10-CM | POA: Insufficient documentation

## 2013-04-24 MED ORDER — ZOLEDRONIC ACID 5 MG/100ML IV SOLN
5.0000 mg | Freq: Once | INTRAVENOUS | Status: AC
  Administered 2013-04-24: 5 mg via INTRAVENOUS
  Filled 2013-04-24: qty 100

## 2013-04-24 MED ORDER — SODIUM CHLORIDE 0.9 % IV SOLN
Freq: Once | INTRAVENOUS | Status: AC
  Administered 2013-04-24: 08:00:00 via INTRAVENOUS

## 2014-04-28 ENCOUNTER — Ambulatory Visit
Admission: RE | Admit: 2014-04-28 | Discharge: 2014-04-28 | Disposition: A | Discharge status: Home / Self Care | Payer: BC Managed Care – PPO | Source: Ambulatory Visit | Attending: Family Medicine | Admitting: Family Medicine

## 2014-04-28 ENCOUNTER — Other Ambulatory Visit: Payer: Self-pay | Admitting: Family Medicine

## 2014-04-28 DIAGNOSIS — J45901 Unspecified asthma with (acute) exacerbation: Secondary | ICD-10-CM

## 2014-07-27 ENCOUNTER — Other Ambulatory Visit: Payer: Self-pay | Admitting: Family Medicine

## 2014-07-27 ENCOUNTER — Ambulatory Visit
Admission: RE | Admit: 2014-07-27 | Discharge: 2014-07-27 | Disposition: A | Discharge status: Home / Self Care | Payer: BLUE CROSS/BLUE SHIELD | Source: Ambulatory Visit | Attending: Family Medicine | Admitting: Family Medicine

## 2014-07-27 DIAGNOSIS — J189 Pneumonia, unspecified organism: Secondary | ICD-10-CM

## 2015-05-19 ENCOUNTER — Encounter (HOSPITAL_COMMUNITY): Payer: Self-pay | Admitting: Emergency Medicine

## 2015-05-19 ENCOUNTER — Emergency Department (HOSPITAL_COMMUNITY)
Admission: EM | Admit: 2015-05-19 | Discharge: 2015-05-20 | Disposition: A | Discharge status: Home / Self Care | Payer: BLUE CROSS/BLUE SHIELD | Attending: Emergency Medicine | Admitting: Emergency Medicine

## 2015-05-19 DIAGNOSIS — R011 Cardiac murmur, unspecified: Secondary | ICD-10-CM | POA: Diagnosis not present

## 2015-05-19 DIAGNOSIS — Z87891 Personal history of nicotine dependence: Secondary | ICD-10-CM | POA: Diagnosis not present

## 2015-05-19 DIAGNOSIS — Z7951 Long term (current) use of inhaled steroids: Secondary | ICD-10-CM | POA: Insufficient documentation

## 2015-05-19 DIAGNOSIS — E876 Hypokalemia: Secondary | ICD-10-CM | POA: Diagnosis present

## 2015-05-19 DIAGNOSIS — Z862 Personal history of diseases of the blood and blood-forming organs and certain disorders involving the immune mechanism: Secondary | ICD-10-CM | POA: Insufficient documentation

## 2015-05-19 DIAGNOSIS — Z8679 Personal history of other diseases of the circulatory system: Secondary | ICD-10-CM | POA: Insufficient documentation

## 2015-05-19 DIAGNOSIS — Z79899 Other long term (current) drug therapy: Secondary | ICD-10-CM | POA: Diagnosis not present

## 2015-05-19 DIAGNOSIS — F319 Bipolar disorder, unspecified: Secondary | ICD-10-CM | POA: Diagnosis not present

## 2015-05-19 DIAGNOSIS — R7989 Other specified abnormal findings of blood chemistry: Secondary | ICD-10-CM

## 2015-05-19 LAB — CBC WITH DIFFERENTIAL/PLATELET
BASOS PCT: 1 %
Basophils Absolute: 0 10*3/uL (ref 0.0–0.1)
Eosinophils Absolute: 0.1 10*3/uL (ref 0.0–0.7)
Eosinophils Relative: 1 %
HEMATOCRIT: 35.9 % — AB (ref 36.0–46.0)
HEMOGLOBIN: 11.4 g/dL — AB (ref 12.0–15.0)
LYMPHS ABS: 1.6 10*3/uL (ref 0.7–4.0)
Lymphocytes Relative: 33 %
MCH: 29.8 pg (ref 26.0–34.0)
MCHC: 31.8 g/dL (ref 30.0–36.0)
MCV: 93.7 fL (ref 78.0–100.0)
MONOS PCT: 12 %
Monocytes Absolute: 0.6 10*3/uL (ref 0.1–1.0)
NEUTROS PCT: 53 %
Neutro Abs: 2.6 10*3/uL (ref 1.7–7.7)
Platelets: 232 10*3/uL (ref 150–400)
RBC: 3.83 MIL/uL — AB (ref 3.87–5.11)
RDW: 13.8 % (ref 11.5–15.5)
WBC: 4.9 10*3/uL (ref 4.0–10.5)

## 2015-05-19 LAB — I-STAT CHEM 8, ED
BUN: 28 mg/dL — AB (ref 6–20)
BUN: 34 mg/dL — ABNORMAL HIGH (ref 6–20)
CALCIUM ION: 1.08 mmol/L — AB (ref 1.13–1.30)
CHLORIDE: 100 mmol/L — AB (ref 101–111)
CHLORIDE: 102 mmol/L (ref 101–111)
CREATININE: 1.6 mg/dL — AB (ref 0.44–1.00)
CREATININE: 1.9 mg/dL — AB (ref 0.44–1.00)
Calcium, Ion: 1.04 mmol/L — ABNORMAL LOW (ref 1.13–1.30)
GLUCOSE: 116 mg/dL — AB (ref 65–99)
Glucose, Bld: 115 mg/dL — ABNORMAL HIGH (ref 65–99)
HCT: 39 % (ref 36.0–46.0)
HEMATOCRIT: 33 % — AB (ref 36.0–46.0)
Hemoglobin: 11.2 g/dL — ABNORMAL LOW (ref 12.0–15.0)
Hemoglobin: 13.3 g/dL (ref 12.0–15.0)
POTASSIUM: 3 mmol/L — AB (ref 3.5–5.1)
Potassium: 2.5 mmol/L — CL (ref 3.5–5.1)
SODIUM: 138 mmol/L (ref 135–145)
Sodium: 141 mmol/L (ref 135–145)
TCO2: 25 mmol/L (ref 0–100)
TCO2: 26 mmol/L (ref 0–100)

## 2015-05-19 MED ORDER — POTASSIUM CHLORIDE CRYS ER 20 MEQ PO TBCR
40.0000 meq | EXTENDED_RELEASE_TABLET | Freq: Once | ORAL | Status: AC
  Administered 2015-05-19: 40 meq via ORAL
  Filled 2015-05-19: qty 2

## 2015-05-19 MED ORDER — SODIUM CHLORIDE 0.9 % IV BOLUS (SEPSIS)
1000.0000 mL | Freq: Once | INTRAVENOUS | Status: AC
  Administered 2015-05-19: 1000 mL via INTRAVENOUS

## 2015-05-19 MED ORDER — POTASSIUM CHLORIDE 10 MEQ/100ML IV SOLN
10.0000 meq | INTRAVENOUS | Status: AC
  Administered 2015-05-19 (×2): 10 meq via INTRAVENOUS
  Filled 2015-05-19 (×2): qty 100

## 2015-05-19 NOTE — ED Provider Notes (Signed)
CSN: [id number]     Arrival date & time 05/19/15  1835 History   First MD Initiated Contact with Patient 05/19/15 2126     Chief Complaint  Patient presents with  . Abnormal Lab     (Consider location/radiation/quality/duration/timing/severity/associated sxs/prior Treatment) HPI Comments: Patient is a 64 year old female with a history of chronic hypokalemia, bipolar 1 disorder, iron deficiency anemia, and WPW status post ablation 2. She presents to the emergency department for concerns over hypokalemia. She reports that she was called by her doctor today and told that her "potassium was dangerously low at 2.6". She states that she is supposed to be taking oral potassium, but she has not been taking it for 6 months because the pills are hard to swallow and unpleasant. She states that she follows up with her doctor every 6 months. She has no complaints of any symptoms including syncope or near-syncope, chest pain, shortness of breath, abdominal pain, nausea, or vomiting. Patient was previously followed by a cardiologist for her WPW, but is no longer.  The history is provided by the patient. No language interpreter was used.    Past Medical History  Diagnosis Date  . Chronic edema     LOWER EXTREMITY  . Hypoalbuminemia   . Chronic hypokalemia   . Mood disorder (HCC)   . Bipolar 1 disorder (HCC)   . Iron deficiency anemia   . Attention deficit disorder   . Hyperactivity disorder   . Chronic alcohol abuse   . Wolff-Parkinson-White syndrome     status post ablation x 2  . Malnutrition (HCC)   . Depression    Past Surgical History  Procedure Laterality Date  . Gastric bypass sur    . Gastric bypass    . Laparotomy    . Laparoscopic gastrotomy w/ repair of ulcer     Family History  Problem Relation Age of Onset  . Other Father 7965    cardiac death  . Breast cancer Mother     deceased  . Diabetes Brother    Social History  Substance Use Topics  . Smoking status: Former Smoker     Types: Cigarettes    Quit date: 06/11/1988  . Smokeless tobacco: None  . Alcohol Use: Yes   OB History    No data available      Review of Systems  All other systems reviewed and are negative.   Allergies  Prednisone  Home Medications   Prior to Admission medications   Medication Sig Start Date End Date Taking? Authorizing Provider  albuterol (PROVENTIL HFA;VENTOLIN HFA) 108 (90 BASE) MCG/ACT inhaler Inhale 1 puff into the lungs every 6 (six) hours as needed for wheezing or shortness of breath.   Yes Historical Provider, MD  ARIPiprazole (ABILIFY) 20 MG tablet Take 20 mg by mouth daily. Take 1/2 tab daily    Yes Historical Provider, MD  azelastine (ASTELIN) 137 MCG/SPRAY nasal spray 1 spray by Nasal route 2 (two) times daily. Use in each nostril as directed    Yes Historical Provider, MD  budesonide-formoterol (SYMBICORT) 160-4.5 MCG/ACT inhaler Inhale 2 puffs into the lungs 2 (two) times daily. prn    Yes Historical Provider, MD  Cholecalciferol (VITAMIN D PO) Take 1 tablet by mouth daily.   Yes Historical Provider, MD  citalopram (CELEXA) 20 MG tablet Take 20 mg by mouth daily.     Yes Historical Provider, MD  clonazePAM (KLONOPIN) 1 MG tablet Take 1 mg by mouth 2 (two) times daily.  Yes Historical Provider, MD  fluticasone (FLONASE) 50 MCG/ACT nasal spray 2 sprays by Nasal route daily.     Yes Historical Provider, MD  furosemide (LASIX) 40 MG tablet Take 40 mg by mouth daily.     Yes Historical Provider, MD  Multiple Vitamin (MULTIVITAMIN) tablet Take 1 tablet by mouth daily.    Yes Historical Provider, MD  oxyCODONE-acetaminophen (PERCOCET) 10-325 MG tablet Take 1 tablet by mouth every 4 (four) hours as needed for pain.   Yes Historical Provider, MD  promethazine (PHENERGAN) 25 MG tablet Take 25 mg by mouth every 6 (six) hours as needed for nausea.    Yes Historical Provider, MD  triamterene-hydrochlorothiazide (MAXZIDE-25) 37.5-25 MG per tablet Take 1 tablet by mouth  daily.     Yes Historical Provider, MD  zolpidem (AMBIEN) 10 MG tablet Take 10 mg by mouth at bedtime as needed for sleep.    Yes Historical Provider, MD  sertraline (ZOLOFT) 50 MG tablet Take 50 mg by mouth daily.    Historical Provider, MD   BP 106/75 mmHg  Pulse 76  Temp(Src) 98.2 F (36.8 C) (Oral)  Resp 18  SpO2 99%   Physical Exam  Constitutional: She is oriented to person, place, and time. She appears well-developed and well-nourished. No distress.  Patient pleasant, well appearing.  HENT:  Head: Normocephalic and atraumatic.  Eyes: Conjunctivae and EOM are normal. No scleral icterus.  Neck: Normal range of motion.  Cardiovascular: Normal rate, regular rhythm and intact distal pulses.   Murmur heard. Pulmonary/Chest: Effort normal and breath sounds normal. No respiratory distress. She has no wheezes. She has no rales.  Respirations even and unlabored. Lungs clear.  Abdominal: Soft. She exhibits no distension. There is no tenderness. There is no rebound.  Soft, nontender.  Musculoskeletal: Normal range of motion.  Neurological: She is alert and oriented to person, place, and time. She exhibits normal muscle tone. Coordination normal.  Skin: Skin is warm and dry. No rash noted. She is not diaphoretic. No erythema. No pallor.  Psychiatric: She has a normal mood and affect. Her behavior is normal.  Nursing note and vitals reviewed.   ED Course  Procedures (including critical care time) Labs Review Labs Reviewed  CBC WITH DIFFERENTIAL/PLATELET - Abnormal; Notable for the following:    RBC 3.83 (*)    Hemoglobin 11.4 (*)    HCT 35.9 (*)    All other components within normal limits  POTASSIUM - Abnormal; Notable for the following:    Potassium 3.2 (*)    All other components within normal limits  I-STAT CHEM 8, ED - Abnormal; Notable for the following:    Potassium 3.0 (*)    BUN 34 (*)    Creatinine, Ser 1.90 (*)    Glucose, Bld 116 (*)    Calcium, Ion 1.08 (*)     All other components within normal limits  I-STAT CHEM 8, ED - Abnormal; Notable for the following:    Potassium 2.5 (*)    Chloride 100 (*)    BUN 28 (*)    Creatinine, Ser 1.60 (*)    Glucose, Bld 115 (*)    Calcium, Ion 1.04 (*)    Hemoglobin 11.2 (*)    HCT 33.0 (*)    All other components within normal limits    Imaging Review No results found.   I have personally reviewed and evaluated these images and lab results as part of my medical decision-making.   EKG Interpretation   Date/Time:  Thursday May 19 2015 18:43:11 EST Ventricular Rate:  85 PR Interval:  132 QRS Duration: 72 QT Interval:  360 QTC Calculation: 428 R Axis:   93 Text Interpretation:  Sinus rhythm with Premature atrial complexes  Rightward axis Cannot rule out Anterior infarct , age undetermined  Abnormal ECG No significant change since last tracing Confirmed by YAO   MD, DAVID ([id number]) on 05/19/2015 10:53:33 PM      MDM   Final diagnoses:  Chronic hypokalemia  Elevated serum creatinine    64 year old female presents to the emergency department she was told by her primary care doctor that her potassium was "dangerously low". Patient with hypokalemia of 3.0 today. This appears consistent with her baseline back to 2012. Potassium was repleted with 2 rounds of IV K+ and  oral potassium. Her potassium has increased slightly with this to 3.2. Patient reports that she has oral potassium at home to take. She has been noncompliant with this medication over the past 6 months because of discomfort swallowing a large pill. She had no difficulty tolerating oral potassium in the emergency department. I believe she is stable for further management as an outpatient with recheck of her potassium by her primary care doctor in 1 week.  Patient also found to have a significant elevation her creatinine compared to her baseline 4 years ago. I was unable to obtain the patient's interim records to see if this is a  normal trend for her or something more acute. Her kidney function has responded to IV fluids. I suspect that her elevated creatinine is due to dehydration which may or may not be worsened secondary to her diuretic medications. Patient to have her kidney function rechecked by her primary care provider in one week as well. No indication for further emergent workup at this time. Patient discharged in good condition; return precautions provided. Patient agreeable to plan with no unaddressed concerns; VSS.   Filed Vitals:   05/19/15 2300 05/19/15 2330 05/20/15 0030 05/20/15 0100  BP: 115/73 102/75 108/72 106/75  Pulse: 75 77 74 76  Temp:    98.2 F (36.8 C)  TempSrc:    Oral  Resp: SpO2: 98% 99% 99% 99%     Antony Madura, PA-C 05/20/15 0114  Richardean Canal, MD 05/23/15 1500

## 2015-05-19 NOTE — ED Notes (Signed)
Pt from home sent by PCP after being told her potassium is low from blood drawn this afternoon.  Pt ambulatory, NAD, A&O.

## 2015-05-20 LAB — POTASSIUM: Potassium: 3.2 mmol/L — ABNORMAL LOW (ref 3.5–5.1)

## 2015-05-20 NOTE — ED Notes (Signed)
Pt A&Ox4, ambulatory at d/c with steady gait, NAD 

## 2015-05-20 NOTE — Discharge Instructions (Signed)
Take your prescribed potassium tablets. Follow-up with your primary doctor in 1 week for a recheck of your potassium level. Also had your kidney function rechecked. Try and increase the amount of watery you are drinking daily, at least 8 glasses of water per day. It is possible that your fluid pill (Lasix) may be contributing to your worsening kidney function compared to 2012. It is also possible that your kidney function may be unchanged from your primary doctor's most recent tests, but we are unable to access these today. Return to the emergency department as needed if symptoms worsen.  Hypokalemia Hypokalemia means that the amount of potassium in the blood is lower than normal.Potassium is a chemical, called an electrolyte, that helps regulate the amount of fluid in the body. It also stimulates muscle contraction and helps nerves function properly.Most of the body's potassium is inside of cells, and only a very small amount is in the blood. Because the amount in the blood is so small, minor changes can be life-threatening. CAUSES  Antibiotics.  Diarrhea or vomiting.  Using laxatives too much, which can cause diarrhea.  Chronic kidney disease.  Water pills (diuretics).  Eating disorders (bulimia).  Low magnesium level.  Sweating a lot. SIGNS AND SYMPTOMS  Weakness.  Constipation.  Fatigue.  Muscle cramps.  Mental confusion.  Skipped heartbeats or irregular heartbeat (palpitations).  Tingling or numbness. DIAGNOSIS  Your health care provider can diagnose hypokalemia with blood tests. In addition to checking your potassium level, your health care provider may also check other lab tests. TREATMENT Hypokalemia can be treated with potassium supplements taken by mouth or adjustments in your current medicines. If your potassium level is very low, you may need to get potassium through a vein (IV) and be monitored in the hospital. A diet high in potassium is also helpful. Foods high  in potassium are:  Nuts, such as peanuts and pistachios.  Seeds, such as sunflower seeds and pumpkin seeds.  Peas, lentils, and lima beans.  Whole grain and bran cereals and breads.  Fresh fruit and vegetables, such as apricots, avocado, bananas, cantaloupe, kiwi, oranges, tomatoes, asparagus, and potatoes.  Orange and tomato juices.  Red meats.  Fruit yogurt. HOME CARE INSTRUCTIONS  Take all medicines as prescribed by your health care provider.  Maintain a healthy diet by including nutritious food, such as fruits, vegetables, nuts, whole grains, and lean meats.  If you are taking a laxative, be sure to follow the directions on the label. SEEK MEDICAL CARE IF:  Your weakness gets worse.  You feel your heart pounding or racing.  You are vomiting or having diarrhea.  You are diabetic and having trouble keeping your blood glucose in the normal range. SEEK IMMEDIATE MEDICAL CARE IF:  You have chest pain, shortness of breath, or dizziness.  You are vomiting or having diarrhea for more than 2 days.  You faint. MAKE SURE YOU:   Understand these instructions.  Will watch your condition.  Will get help right away if you are not doing well or get worse.   This information is not intended to replace advice given to you by your health care provider. Make sure you discuss any questions you have with your health care provider.   Document Released: 05/28/2005 Document Revised: 06/18/2014 Document Reviewed: 11/28/2012 Elsevier Interactive Patient Education [employer]2016 Else[employer] Inc.

## 2016-06-18 ENCOUNTER — Ambulatory Visit
Admission: RE | Admit: 2016-06-18 | Discharge: 2016-06-18 | Disposition: A | Discharge status: Home / Self Care | Payer: Medicare Other | Source: Ambulatory Visit | Attending: Family Medicine | Admitting: Family Medicine

## 2016-06-18 ENCOUNTER — Other Ambulatory Visit: Payer: Self-pay | Admitting: Family Medicine

## 2016-06-18 DIAGNOSIS — R079 Chest pain, unspecified: Secondary | ICD-10-CM

## 2016-06-20 ENCOUNTER — Telehealth (HOSPITAL_COMMUNITY): Payer: Self-pay | Admitting: Family Medicine

## 2016-06-25 NOTE — Telephone Encounter (Signed)
06/20/16 06/20/2016 11:33 AM Phone (Outgoing) [name], [name] (Self) [phone] (H)   Left Message - Called pt and lmsg for her to CB to get scheduled for an echo    By Elita Booneegina A Cleaster Shiffer     06/21/2016 01:15 PM Phone ([address]Outgoing) [name], [name] (Self) [phone] (H)   Left Message - Called pt and lmsg for her to CB .Marland Kitchen.    By Elita Booneegina A Jakyia Gaccione     06/25/2016 11:08 AM Phone ([address]Outgoing) [name], [name] (Self) [phone] (H)   Left Message - Called pt and lmsg for her to CB.Marland Kitchen.RG    By Elita Booneegina A Richy Spradley         I contacted [facility] and spoke with Talbert ForestShirley and she voiced that she would call the patient.

## 2016-06-26 ENCOUNTER — Other Ambulatory Visit: Payer: Self-pay | Admitting: Family Medicine

## 2016-06-26 DIAGNOSIS — R55 Syncope and collapse: Secondary | ICD-10-CM

## 2016-06-26 DIAGNOSIS — E875 Hyperkalemia: Secondary | ICD-10-CM

## 2016-06-26 DIAGNOSIS — R609 Edema, unspecified: Secondary | ICD-10-CM

## 2016-06-29 ENCOUNTER — Ambulatory Visit (HOSPITAL_COMMUNITY): Payer: Medicare Other

## 2016-07-02 ENCOUNTER — Other Ambulatory Visit: Payer: Self-pay | Admitting: Family Medicine

## 2016-07-02 DIAGNOSIS — R29818 Other symptoms and signs involving the nervous system: Secondary | ICD-10-CM

## 2016-07-03 ENCOUNTER — Ambulatory Visit (HOSPITAL_COMMUNITY): Payer: Medicare Other

## 2016-07-10 ENCOUNTER — Other Ambulatory Visit: Payer: Self-pay | Admitting: Family Medicine

## 2016-07-10 ENCOUNTER — Other Ambulatory Visit (HOSPITAL_COMMUNITY)
Admission: RE | Admit: 2016-07-10 | Discharge: 2016-07-10 | Disposition: A | Discharge status: Home / Self Care | Payer: Medicare Other | Source: Ambulatory Visit | Attending: Family Medicine | Admitting: Family Medicine

## 2016-07-10 DIAGNOSIS — M79605 Pain in left leg: Secondary | ICD-10-CM

## 2016-07-10 DIAGNOSIS — Z124 Encounter for screening for malignant neoplasm of cervix: Secondary | ICD-10-CM | POA: Insufficient documentation

## 2016-07-12 ENCOUNTER — Other Ambulatory Visit: Payer: Medicare Other

## 2016-07-12 LAB — CYTOLOGY - PAP: DIAGNOSIS: NEGATIVE

## 2016-07-13 ENCOUNTER — Ambulatory Visit
Admission: RE | Admit: 2016-07-13 | Discharge: 2016-07-13 | Disposition: A | Discharge status: Home / Self Care | Payer: Medicare Other | Source: Ambulatory Visit | Attending: Family Medicine | Admitting: Family Medicine

## 2016-07-13 DIAGNOSIS — M79605 Pain in left leg: Secondary | ICD-10-CM

## 2016-07-13 DIAGNOSIS — R29818 Other symptoms and signs involving the nervous system: Secondary | ICD-10-CM

## 2016-07-17 ENCOUNTER — Other Ambulatory Visit: Payer: Medicare Other

## 2016-07-17 ENCOUNTER — Other Ambulatory Visit (HOSPITAL_COMMUNITY): Payer: Medicare Other

## 2016-08-19 ENCOUNTER — Emergency Department (HOSPITAL_COMMUNITY): Payer: Medicare Other

## 2016-08-19 ENCOUNTER — Encounter (HOSPITAL_COMMUNITY): Payer: Self-pay

## 2016-08-19 ENCOUNTER — Inpatient Hospital Stay (HOSPITAL_COMMUNITY)
Admission: EM | Admit: 2016-08-19 | Discharge: 2016-08-21 | DRG: 885 | Disposition: A | Discharge status: Home / Self Care | Payer: Medicare Other | Attending: Internal Medicine | Admitting: Internal Medicine

## 2016-08-19 ENCOUNTER — Other Ambulatory Visit: Payer: Self-pay

## 2016-08-19 DIAGNOSIS — F319 Bipolar disorder, unspecified: Principal | ICD-10-CM | POA: Diagnosis present

## 2016-08-19 DIAGNOSIS — Z9884 Bariatric surgery status: Secondary | ICD-10-CM | POA: Diagnosis not present

## 2016-08-19 DIAGNOSIS — Z833 Family history of diabetes mellitus: Secondary | ICD-10-CM | POA: Diagnosis not present

## 2016-08-19 DIAGNOSIS — I456 Pre-excitation syndrome: Secondary | ICD-10-CM | POA: Diagnosis present

## 2016-08-19 DIAGNOSIS — F1021 Alcohol dependence, in remission: Secondary | ICD-10-CM | POA: Diagnosis present

## 2016-08-19 DIAGNOSIS — F329 Major depressive disorder, single episode, unspecified: Secondary | ICD-10-CM | POA: Diagnosis present

## 2016-08-19 DIAGNOSIS — F39 Unspecified mood [affective] disorder: Secondary | ICD-10-CM | POA: Diagnosis present

## 2016-08-19 DIAGNOSIS — R748 Abnormal levels of other serum enzymes: Secondary | ICD-10-CM | POA: Diagnosis not present

## 2016-08-19 DIAGNOSIS — Z87891 Personal history of nicotine dependence: Secondary | ICD-10-CM | POA: Diagnosis not present

## 2016-08-19 DIAGNOSIS — E876 Hypokalemia: Secondary | ICD-10-CM | POA: Diagnosis present

## 2016-08-19 DIAGNOSIS — N189 Chronic kidney disease, unspecified: Secondary | ICD-10-CM | POA: Diagnosis present

## 2016-08-19 DIAGNOSIS — Z79899 Other long term (current) drug therapy: Secondary | ICD-10-CM | POA: Diagnosis not present

## 2016-08-19 DIAGNOSIS — Z888 Allergy status to other drugs, medicaments and biological substances status: Secondary | ICD-10-CM | POA: Diagnosis not present

## 2016-08-19 DIAGNOSIS — R609 Edema, unspecified: Secondary | ICD-10-CM | POA: Diagnosis not present

## 2016-08-19 DIAGNOSIS — Z7951 Long term (current) use of inhaled steroids: Secondary | ICD-10-CM

## 2016-08-19 DIAGNOSIS — R4182 Altered mental status, unspecified: Secondary | ICD-10-CM | POA: Diagnosis present

## 2016-08-19 DIAGNOSIS — M810 Age-related osteoporosis without current pathological fracture: Secondary | ICD-10-CM | POA: Diagnosis present

## 2016-08-19 DIAGNOSIS — R296 Repeated falls: Secondary | ICD-10-CM | POA: Diagnosis present

## 2016-08-19 DIAGNOSIS — F1010 Alcohol abuse, uncomplicated: Secondary | ICD-10-CM | POA: Diagnosis present

## 2016-08-19 DIAGNOSIS — Z79891 Long term (current) use of opiate analgesic: Secondary | ICD-10-CM | POA: Diagnosis not present

## 2016-08-19 DIAGNOSIS — Z803 Family history of malignant neoplasm of breast: Secondary | ICD-10-CM

## 2016-08-19 DIAGNOSIS — R443 Hallucinations, unspecified: Secondary | ICD-10-CM | POA: Diagnosis present

## 2016-08-19 DIAGNOSIS — F988 Other specified behavioral and emotional disorders with onset usually occurring in childhood and adolescence: Secondary | ICD-10-CM | POA: Diagnosis present

## 2016-08-19 DIAGNOSIS — F32A Depression, unspecified: Secondary | ICD-10-CM | POA: Diagnosis present

## 2016-08-19 HISTORY — DX: Unspecified asthma, uncomplicated: J45.909

## 2016-08-19 HISTORY — DX: Unspecified osteoarthritis, unspecified site: M19.90

## 2016-08-19 HISTORY — DX: Attention-deficit hyperactivity disorder, unspecified type: F90.9

## 2016-08-19 HISTORY — DX: Unspecified chronic bronchitis: J42

## 2016-08-19 HISTORY — DX: Personal history of urinary calculi: Z87.442

## 2016-08-19 HISTORY — DX: Anxiety disorder, unspecified: F41.9

## 2016-08-19 HISTORY — DX: Other chronic pain: G89.29

## 2016-08-19 HISTORY — DX: Dorsalgia, unspecified: M54.9

## 2016-08-19 HISTORY — DX: Gastro-esophageal reflux disease without esophagitis: K21.9

## 2016-08-19 HISTORY — DX: Fibromyalgia: M79.7

## 2016-08-19 HISTORY — DX: Pneumonia, unspecified organism: J18.9

## 2016-08-19 LAB — DIFFERENTIAL
BASOS PCT: 0 %
Basophils Absolute: 0 10*3/uL (ref 0.0–0.1)
Eosinophils Absolute: 0 10*3/uL (ref 0.0–0.7)
Eosinophils Relative: 0 %
LYMPHS PCT: 14 %
Lymphs Abs: 1.3 10*3/uL (ref 0.7–4.0)
MONO ABS: 0.6 10*3/uL (ref 0.1–1.0)
Monocytes Relative: 6 %
NEUTROS PCT: 80 %
Neutro Abs: 7.5 10*3/uL (ref 1.7–7.7)

## 2016-08-19 LAB — BASIC METABOLIC PANEL
Anion gap: 15 (ref 5–15)
BUN: 36 mg/dL — AB (ref 6–20)
CALCIUM: 6 mg/dL — AB (ref 8.9–10.3)
CHLORIDE: 98 mmol/L — AB (ref 101–111)
CO2: 23 mmol/L (ref 22–32)
Creatinine, Ser: 1.7 mg/dL — ABNORMAL HIGH (ref 0.44–1.00)
GFR calc Af Amer: 35 mL/min — ABNORMAL LOW (ref >60)
GFR calc non Af Amer: 30 mL/min — ABNORMAL LOW (ref >60)
GLUCOSE: 123 mg/dL — AB (ref 65–99)
Potassium: 3 mmol/L — ABNORMAL LOW (ref 3.5–5.1)
Sodium: 136 mmol/L (ref 135–145)

## 2016-08-19 LAB — TSH: TSH: 1.233 u[IU]/mL (ref 0.350–4.500)

## 2016-08-19 LAB — CBC
HEMATOCRIT: 34.9 % — AB (ref 36.0–46.0)
HEMOGLOBIN: 10.8 g/dL — AB (ref 12.0–15.0)
MCH: 27.7 pg (ref 26.0–34.0)
MCHC: 30.9 g/dL (ref 30.0–36.0)
MCV: 89.5 fL (ref 78.0–100.0)
Platelets: 140 10*3/uL — ABNORMAL LOW (ref 150–400)
RBC: 3.9 MIL/uL (ref 3.87–5.11)
RDW: 13.7 % (ref 11.5–15.5)
WBC: 9.3 10*3/uL (ref 4.0–10.5)

## 2016-08-19 LAB — URINALYSIS, ROUTINE W REFLEX MICROSCOPIC
Bacteria, UA: NONE SEEN
Bilirubin Urine: NEGATIVE
GLUCOSE, UA: NEGATIVE mg/dL
Hgb urine dipstick: NEGATIVE
KETONES UR: NEGATIVE mg/dL
Nitrite: NEGATIVE
PROTEIN: NEGATIVE mg/dL
Specific Gravity, Urine: 1.01 (ref 1.005–1.030)
pH: 5 (ref 5.0–8.0)

## 2016-08-19 LAB — RAPID URINE DRUG SCREEN, HOSP PERFORMED
Amphetamines: NOT DETECTED
BARBITURATES: NOT DETECTED
BENZODIAZEPINES: NOT DETECTED
COCAINE: NOT DETECTED
Opiates: POSITIVE — AB
Tetrahydrocannabinol: NOT DETECTED

## 2016-08-19 LAB — COMPREHENSIVE METABOLIC PANEL
ALBUMIN: 3.2 g/dL — AB (ref 3.5–5.0)
ALK PHOS: 66 U/L (ref 38–126)
ALT: 40 U/L (ref 14–54)
ANION GAP: 17 — AB (ref 5–15)
AST: 104 U/L — ABNORMAL HIGH (ref 15–41)
BILIRUBIN TOTAL: 2.1 mg/dL — AB (ref 0.3–1.2)
BUN: 42 mg/dL — ABNORMAL HIGH (ref 6–20)
CALCIUM: 5.5 mg/dL — AB (ref 8.9–10.3)
CO2: 19 mmol/L — ABNORMAL LOW (ref 22–32)
Chloride: 101 mmol/L (ref 101–111)
Creatinine, Ser: 1.89 mg/dL — ABNORMAL HIGH (ref 0.44–1.00)
GFR calc Af Amer: 31 mL/min — ABNORMAL LOW (ref >60)
GFR, EST NON AFRICAN AMERICAN: 27 mL/min — AB (ref >60)
Glucose, Bld: 71 mg/dL (ref 65–99)
POTASSIUM: 4.1 mmol/L (ref 3.5–5.1)
Sodium: 137 mmol/L (ref 135–145)
TOTAL PROTEIN: 5.7 g/dL — AB (ref 6.5–8.1)

## 2016-08-19 LAB — CK: Total CK: 3777 U/L — ABNORMAL HIGH (ref 38–234)

## 2016-08-19 LAB — PHOSPHORUS: Phosphorus: 6.1 mg/dL — ABNORMAL HIGH (ref 2.5–4.6)

## 2016-08-19 LAB — SALICYLATE LEVEL: Salicylate Lvl: <7 mg/dL (ref 2.8–30.0)

## 2016-08-19 LAB — ACETAMINOPHEN LEVEL: Acetaminophen (Tylenol), Serum: 14 ug/mL (ref 10–30)

## 2016-08-19 LAB — I-STAT CG4 LACTIC ACID, ED: Lactic Acid, Venous: 0.73 mmol/L (ref 0.5–1.9)

## 2016-08-19 LAB — MAGNESIUM: MAGNESIUM: 2 mg/dL (ref 1.7–2.4)

## 2016-08-19 LAB — PROTIME-INR
INR: 1.45
PROTHROMBIN TIME: 17.8 s — AB (ref 11.4–15.2)

## 2016-08-19 LAB — CBG MONITORING, ED: GLUCOSE-CAPILLARY: 71 mg/dL (ref 65–99)

## 2016-08-19 LAB — AMMONIA: Ammonia: 22 umol/L (ref 9–35)

## 2016-08-19 LAB — ETHANOL: Alcohol, Ethyl (B): <5 mg/dL (ref <5)

## 2016-08-19 LAB — I-STAT TROPONIN, ED: Troponin i, poc: 0 ng/mL (ref 0.00–0.08)

## 2016-08-19 MED ORDER — LORAZEPAM 0.5 MG PO TABS
0.5000 mg | ORAL_TABLET | Freq: Once | ORAL | Status: AC
  Administered 2016-08-19: 0.5 mg via ORAL
  Filled 2016-08-19: qty 1

## 2016-08-19 MED ORDER — SODIUM CHLORIDE 0.9 % IV SOLN
1.0000 g | Freq: Once | INTRAVENOUS | Status: AC
  Administered 2016-08-19: 1 g via INTRAVENOUS
  Filled 2016-08-19: qty 10

## 2016-08-19 NOTE — ED Triage Notes (Signed)
To room via EMS.  Onset 2 days pt has fell multiple times, hit head today, lump noted, No LOC.  Pt had friend spend the night with her last night, she left this morning to go to church.  Pt reports she called husband, who lives 4-5 miles from her, and told him that her friend wouldn't answer her and her friend is hanging from the doorway.  Pt was frantic when EMS, fire, and police showed up.  EMS called her friend, Burna MortimerWanda, and told them she last talked to pt at 3:30p and seemed little confused.  Pt is A&Ox 4, hand grips equal, no facial grimace or arm drift.  Pt has bruising and abrasions to widespread body.  When pt stops talking she seems to forget where she is in her story she is telling.

## 2016-08-19 NOTE — ED Notes (Signed)
CBG 71.  

## 2016-08-19 NOTE — ED Triage Notes (Signed)
Pt taken to BR on arrival via wheelchair, is very unsteady on feet.

## 2016-08-19 NOTE — ED Provider Notes (Signed)
[facility]-EMERGENCY DEPT Provider Note   CSN: [id number] Arrival date & time: 08/19/16  1721     History   Chief Complaint Chief Complaint  Patient presents with  . Fall  . Altered Mental Status  . Gait Problem    HPI HENNA DERDERIAN is a 66 y.o. female.  HPI   Patient is a 66 year old female presenting with altered mental status. It is very unclear what happened to patient. Patient reports that since Friday she's had multiple falls. However looking back looks. She got an MRI done last month for multiple falls. Patient's husband who does not live with her, received a phone call from patient is a stating that she had a family friend that was "hanging on a close hanger". Patient states that she saw her friend hanging from the doorway. However it is obvious that the friend was not even in the same building as her. And she certainly was not pain herself by the doorway. When confronted about this now patient's is confused but states "I saw her standing on top of the close hanger hanging from the door". Patient appears to be having hallucinations.  Patient denies alcohol use currently. However she has history of alcohol abuse.  She states she's had multiple "syncopal event" where she passes out and falls down and is unsteady.  Patient's unreliable historian.    Past Medical History:  Diagnosis Date  . Attention deficit disorder   . Bipolar 1 disorder (HCC)   . Chronic alcohol abuse   . Chronic edema    LOWER EXTREMITY  . Chronic hypokalemia   . Depression   . Hyperactivity disorder   . Hypoalbuminemia   . Iron deficiency anemia   . Malnutrition (HCC)   . Mood disorder (HCC)   . Wolff-Parkinson-White syndrome    status post ablation x 2    Patient Active Problem List   Diagnosis Date Noted  . Palpitations 10/01/2010  . Hypertension 10/01/2010    Past Surgical History:  Procedure Laterality Date  . GASTRIC BYPASS    . gastric bypass sur    . LAPAROSCOPIC GASTROTOMY  W/ REPAIR OF ULCER    . LAPAROTOMY      OB History    No data available       Home Medications    Prior to Admission medications   Medication Sig Start Date End Date Taking? Authorizing Provider  albuterol (PROVENTIL HFA;VENTOLIN HFA) 108 (90 BASE) MCG/ACT inhaler Inhale 1 puff into the lungs every 6 (six) hours as needed for wheezing or shortness of breath.    Historical Provider, MD  ARIPiprazole (ABILIFY) 20 MG tablet Take 20 mg by mouth daily. Take 1/2 tab daily     Historical Provider, MD  azelastine (ASTELIN) 137 MCG/SPRAY nasal spray 1 spray by Nasal route 2 (two) times daily. Use in each nostril as directed     Historical Provider, MD  budesonide-formoterol (SYMBICORT) 160-4.5 MCG/ACT inhaler Inhale 2 puffs into the lungs 2 (two) times daily. prn     Historical Provider, MD  Cholecalciferol (VITAMIN D PO) Take 1 tablet by mouth daily.    Historical Provider, MD  citalopram (CELEXA) 20 MG tablet Take 20 mg by mouth daily.      Historical Provider, MD  clonazePAM (KLONOPIN) 1 MG tablet Take 1 mg by mouth 2 (two) times daily.     Historical Provider, MD  fluticasone (FLONASE) 50 MCG/ACT nasal spray 2 sprays by Nasal route daily.      Historical Provider,  MD  furosemide (LASIX) 40 MG tablet Take 40 mg by mouth daily.      Historical Provider, MD  Multiple Vitamin (MULTIVITAMIN) tablet Take 1 tablet by mouth daily.     Historical Provider, MD  oxyCODONE-acetaminophen (PERCOCET) 10-325 MG tablet Take 1 tablet by mouth every 4 (four) hours as needed for pain.    Historical Provider, MD  promethazine (PHENERGAN) 25 MG tablet Take 25 mg by mouth every 6 (six) hours as needed for nausea.     Historical Provider, MD  sertraline (ZOLOFT) 50 MG tablet Take 50 mg by mouth daily.    Historical Provider, MD  triamterene-hydrochlorothiazide (MAXZIDE-25) 37.5-25 MG per tablet Take 1 tablet by mouth daily.      Historical Provider, MD  zolpidem (AMBIEN) 10 MG tablet Take 10 mg by mouth at bedtime as  needed for sleep.     Historical Provider, MD    Family History Family History  Problem Relation Age of Onset  . Other Father 21    cardiac death  . Breast cancer Mother     deceased  . Diabetes Brother     Social History Social History  Substance Use Topics  . Smoking status: Former Smoker    Types: Cigarettes    Quit date: 06/11/1988  . Smokeless tobacco: Never Used  . Alcohol use No     Comment: pt denies     Allergies   Prednisone   Review of Systems Review of Systems  Constitutional: Positive for activity change.  Respiratory: Negative for shortness of breath.   Cardiovascular: Negative for chest pain.  Gastrointestinal: Negative for abdominal pain.  Psychiatric/Behavioral: Positive for confusion and hallucinations.     Physical Exam Updated Vital Signs BP 122/77 (BP Location: Left Arm)   Pulse 84   Temp 98.2 F (36.8 C) (Oral)   Resp 18   SpO2 99%   Physical Exam  Constitutional: She is oriented to person, place, and time. She appears well-developed and well-nourished.  HENT:  Head: Normocephalic and atraumatic.  cheilitis  Eyes: Right eye exhibits no discharge.  Cardiovascular: Normal rate and regular rhythm.   Murmur heard. Pulmonary/Chest: Effort normal and breath sounds normal. She has no wheezes. She has no rales.  Abdominal: Soft. She exhibits no distension. There is no tenderness.  Neurological: She is oriented to person, place, and time.  Skin: Skin is warm and dry. She is not diaphoretic.  Bruising diffusely.  Psychiatric:  Patient endorses what sounds to be hallucinations.  Nursing note and vitals reviewed.    ED Treatments / Results  Labs (all labs ordered are listed, but only abnormal results are displayed) Labs Reviewed  COMPREHENSIVE METABOLIC PANEL  CBC  CBC WITH DIFFERENTIAL/PLATELET  AMMONIA  PROTIME-INR  RAPID URINE DRUG SCREEN, HOSP PERFORMED  ETHANOL  SALICYLATE LEVEL  ACETAMINOPHEN LEVEL  RPR  TSH  CBG  MONITORING, ED  I-STAT CG4 LACTIC ACID, ED  I-STAT TROPOININ, ED    EKG  EKG Interpretation  Date/Time:  Sunday August 19 2016 17:21:24 EDT Ventricular Rate:  85 PR Interval:    QRS Duration: 88 QT Interval:  492 QTC Calculation: 586 R Axis:   41 Text Interpretation:  Sinus rhythm Atrial premature complexes in couplets Short PR interval Borderline low voltage, extremity leads Nonspecific T abnrm, anterolateral leads Prolonged QT interval abnormla ekg similar to prior Confirmed by Kandis Mannan ([id number]) on 08/19/2016 5:52:36 PM       Radiology No results found.  Procedures Procedures (including critical care  time)  Medications Ordered in ED Medications - No data to display   Initial Impression / Assessment and Plan / ED Course  I have reviewed the triage vital signs and the nursing notes.  Pertinent labs & imaging results that were available during my care of the patient were reviewed by me and considered in my medical decision making (see chart for details).     Patient is 66 year old female with history of mood disorder, Parkinson's White, chronic alcohol use, presenting today with increasing falls, altered mental status, and hallucinations. Patient continues to endorse hallucination of seeing someone hang themselves. Patient has odd affect. Denies alcohol use. I'm concerned about some kind of drug abuse versus alcohol use versus new onset of psychosis. However patient also has real reason for syncope in her Wolff-Parkinson-White. This therefore we will initiate broad workup here consider admission and then placement in a psychiatric unit if there is no medical cause for her hallucinations  7:44 PM Patient has severe hypocalcemia. Patient has low albumin, but not as severely as I would anticipate with this low calcium. Will send CPK, Phosph, Mag.   Will admit for continued cardiac monitor, continued evaluation of mental status.  CRITICAL CARE Performed by: Arlana Hoveourteney L  Surah Pelley Total critical care time: 60 minutes Critical care time was exclusive of separately billable procedures and treating other patients. Critical care was necessary to treat or prevent imminent or life-threatening deterioration. Critical care was time spent personally by me on the following activities: development of treatment plan with patient and/or surrogate as well as nursing, discussions with consultants, evaluation of patient's response to treatment, examination of patient, obtaining history from patient or surrogate, ordering and performing treatments and interventions, ordering and review of laboratory studies, ordering and review of radiographic studies, pulse oximetry and re-evaluation of patient's condition.   Final Clinical Impressions(s) / ED Diagnoses   Final diagnoses:  None    New Prescriptions New Prescriptions   No medications on file     Cherlyn Syring Randall AnLyn Reese Stockman, MD 08/20/16 0017

## 2016-08-19 NOTE — H&P (Addendum)
History and Physical    Laurie GathersBillie B Cox ZOX:[id number] DOB: 01/29/1951 DOA: 08/19/2016  PCP: Cala BradfordWHITE,CYNTHIA S, MD  Patient coming from: Home  Chief Complaint: falls, multiple, hallucinations  HPI: [name] is a 66 y.o. female with medical history significant of Depression, chronic ETOH current sobriety, bipolar 1 disorder, ADHD, chronic back pain, anxiety who presents for falling and hallucinations.  Per discussion with patient and husband in room, she has been having frequent falls for at least the past month and longer. She reports these falls as "jerking" that "throws me across the room" and she falls.  She has multiple bruises on legs and arms, in various stages of healing.  She has lost consciousness for brief periods of time during these falls.  It began to get even worse starting Friday.  She has further been unsteady on her feet.  She also has LE swelling which seems to be getting worse.  Associated symptoms include leg cramping, twitching in the hands and occasional vomiting.  She denies any paresthesias, tetany, seizures.  She has never lost bowel or bladder during these episodes.  Today, she had acute hallucinations, which are new for her.  Apparently her Mother in law stayed over with her last night and got up to go to church this morning.  When [name] awoke, she thought her MIL was still there, but could not find her.  She searched everywhere and then thought she saw her "hanging on a hook" in the bedroom.  She called 911 because she thought her mother in law was dead.  When EMS and police arrived they convinced her to go to the hospital.  She has continued to hallucinate by seeing bugs crawling on the walls.  She is a former alcoholic, but has stopped drinking some years ago and denies ETOH use.  Ethanol level < 5.    Interestingly, she was found to be profoundly hypocalcemic in the ED.  She reports that she takes Prolia for her osteoporosis, and her last injection was 1  month ago.  She also has had gastric bypass surgery about 10 years ago. She has been out of her vitamins, including Calcium and Vitamin D for 2 weeks.  She is also on quite a few psychiatric medications.  When I asked her to review them she told me " I already told that other lady, please look in the chart."  She further takes lasix for LE swelling, but has not taken it recently.    ED Course: In the ED, she was found to have a Ca of 5.5, recheck was 6.0.  Corrected for Albumin was 6.1.  Ionized Ca was 6.0.  She received 1gm of IV calcium in the ED.  She was also noted to have an elevated CK to 3700.  Magnesium was normal and Phos was 6.1  Review of Systems: As per HPI otherwise 10 point review of systems negative.    Past Medical History:  Diagnosis Date  . Attention deficit disorder   . Bipolar 1 disorder (HCC)   . Chronic alcohol abuse   . Chronic edema    LOWER EXTREMITY  . Chronic hypokalemia   . Depression   . Hyperactivity disorder   . Hypoalbuminemia   . Iron deficiency anemia   . Malnutrition (HCC)   . Mood disorder (HCC)   . Wolff-Parkinson-White syndrome    status post ablation x 2    Past Surgical History:  Procedure Laterality Date  . GASTRIC BYPASS    .  gastric bypass sur    . LAPAROSCOPIC GASTROTOMY W/ REPAIR OF ULCER    . LAPAROTOMY     Confirmed with patient.   reports that she quit smoking about 28 years ago. Her smoking use included Cigarettes. She has never used smokeless tobacco. She reports that she does not drink alcohol or use drugs.  Allergies  Allergen Reactions  . Prednisone     Makes me crazy    Reviewed Family History  Problem Relation Age of Onset  . Other Father 69    cardiac death  . Breast cancer Mother     deceased  . Diabetes Brother      Prior to Admission medications   Medication Sig Start Date End Date Taking? Authorizing Provider  albuterol (PROVENTIL HFA;VENTOLIN HFA) 108 (90 BASE) MCG/ACT inhaler Inhale 1 puff into the  lungs every 6 (six) hours as needed for wheezing or shortness of breath.   Yes Historical Provider, MD  ARIPiprazole (ABILIFY) 20 MG tablet Take 20 mg by mouth daily.    Yes Historical Provider, MD  cyclobenzaprine (FLEXERIL) 10 MG tablet Take 10 mg by mouth 3 (three) times daily as needed for muscle spasms. 08/06/16  Yes Historical Provider, MD  furosemide (LASIX) 20 MG tablet Take 20 mg by mouth every other day.  08/06/16  Yes Historical Provider, MD  gabapentin (NEURONTIN) 300 MG capsule Take 300 mg by mouth 3 (three) times daily. 08/16/16  Yes Historical Provider, MD  KLOR-CON 10 10 MEQ tablet Take 20 mEq by mouth 2 (two) times daily. 06/26/16  Yes Historical Provider, MD  LORazepam (ATIVAN) 1 MG tablet Take 1 mg by mouth 2 (two) times daily as needed for anxiety. 06/25/16  Yes Historical Provider, MD  oxyCODONE-acetaminophen (PERCOCET) 10-325 MG tablet Take 1 tablet by mouth every 4 (four) hours as needed for pain.   Yes Historical Provider, MD  promethazine (PHENERGAN) 25 MG tablet Take 25 mg by mouth every 6 (six) hours as needed for nausea.    Yes Historical Provider, MD  citalopram (CELEXA) 20 MG tablet Take 20 mg by mouth daily.      Historical Provider, MD  clonazePAM (KLONOPIN) 1 MG tablet Take 1 mg by mouth 2 (two) times daily.     Historical Provider, MD  fluticasone (FLONASE) 50 MCG/ACT nasal spray 2 sprays by Nasal route daily.      Historical Provider, MD  furosemide (LASIX) 40 MG tablet Take 40 mg by mouth daily.      Historical Provider, MD  sertraline (ZOLOFT) 50 MG tablet Take 50 mg by mouth daily.    Historical Provider, MD  triamterene-hydrochlorothiazide (MAXZIDE-25) 37.5-25 MG per tablet Take 1 tablet by mouth daily.      Historical Provider, MD    Physical Exam: Vitals:   08/19/16 1940 08/19/16 1945 08/19/16 2000 08/20/16 0120  BP: (!) 106/54 97/62 102/65 119/69  Pulse: 76 79 76 80  Resp: 15 14 21 11   Temp:      TempSrc:      SpO2: 98% 100% 100% 97%    Constitutional:  NAD, calm, comfortable Vitals:   08/19/16 1940 08/19/16 1945 08/19/16 2000 08/20/16 0120  BP: (!) 106/54 97/62 102/65 119/69  Pulse: 76 79 76 80  Resp: 15 14 21 11   Temp:      TempSrc:      SpO2: 98% 100% 100% 97%   Eyes: PERRL, lids and conjunctivae normal ENMT: Mucous membranes are moist. Posterior pharynx clear of any exudate or lesions.  She has temporal wasting.  She has negative Chvostek sign Neck: normal, supple Respiratory: clear to auscultation bilaterally, no wheezing, no crackles. Normal respiratory effort. Cardiovascular: Normal rate and regular rhythm, + systolic murmur, heard best at LUSB.  + non pitting LE edema to knees  Abdomen: no tenderness, no masses palpated.  Bowel sounds positive.  Musculoskeletal: no clubbing / cyanosis. + swelling of both knees Skin: Significant bruising in multiple stages of healing over arms and legs Neurologic: CN 2-12 grossly intact. Sensation intact to light touch. Strength 5/5 in all 4.  Psychiatric: Hallucinations, but normal speech and appears concerned about hallucinating.    Labs on Admission: I have personally reviewed following labs and imaging studies  CBC:  Recent Labs Lab 08/19/16 1750  WBC 9.3  NEUTROABS 7.5  HGB 10.8*  HCT 34.9*  MCV 89.5  PLT 140*   Basic Metabolic Panel:  Recent Labs Lab 08/19/16 1750 08/19/16 2027 08/19/16 2250  NA 137  --  136  K 4.1  --  3.0*  CL 101  --  98*  CO2 19*  --  23  GLUCOSE 71  --  123*  BUN 42*  --  36*  CREATININE 1.89*  --  1.70*  CALCIUM 5.5*  --  6.0*  MG  --  2.0  --   PHOS  --  6.1*  --    GFR: CrCl cannot be calculated (Unknown ideal weight.). Liver Function Tests:  Recent Labs Lab 08/19/16 1750  AST 104*  ALT 40  ALKPHOS 66  BILITOT 2.1*  PROT 5.7*  ALBUMIN 3.2*   No results for input(s): LIPASE, AMYLASE in the last 168 hours.  Recent Labs Lab 08/19/16 1817  AMMONIA 22   Coagulation Profile:  Recent Labs Lab 08/19/16 1817  INR 1.45    Cardiac Enzymes:  Recent Labs Lab 08/19/16 2027  CKTOTAL 3,777*   BNP (last 3 results) No results for input(s): PROBNP in the last 8760 hours. HbA1C: No results for input(s): HGBA1C in the last 72 hours. CBG:  Recent Labs Lab 08/19/16 1742  GLUCAP 71   Lipid Profile: No results for input(s): CHOL, HDL, LDLCALC, TRIG, CHOLHDL, LDLDIRECT in the last 72 hours. Thyroid Function Tests:  Recent Labs  08/19/16 1817  TSH 1.233   Anemia Panel: No results for input(s): VITAMINB12, FOLATE, FERRITIN, TIBC, IRON, RETICCTPCT in the last 72 hours. Urine analysis:    Component Value Date/Time   COLORURINE YELLOW 08/19/2016 2246   APPEARANCEUR CLEAR 08/19/2016 2246   LABSPEC 1.010 08/19/2016 2246   PHURINE 5.0 08/19/2016 2246   GLUCOSEU NEGATIVE 08/19/2016 2246   HGBUR NEGATIVE 08/19/2016 2246   BILIRUBINUR NEGATIVE 08/19/2016 2246   KETONESUR NEGATIVE 08/19/2016 2246   PROTEINUR NEGATIVE 08/19/2016 2246   UROBILINOGEN 0.2 06/02/2010 1917   NITRITE NEGATIVE 08/19/2016 2246   LEUKOCYTESUR MODERATE (A) 08/19/2016 2246    Radiological Exams on Admission: Dg Chest 2 View  Result Date: 08/19/2016 CLINICAL DATA:  Fall.  Altered mental status.  Hallucinations. EXAM: CHEST  2 VIEW COMPARISON:  Chest and rib radiographs 06/18/2016 FINDINGS: The heart is mildly enlarged. Changes of COPD are again noted. There is no focal airspace disease. No acute fracture or pneumothorax is present. Degenerative changes are present at the shoulders. IMPRESSION: 1. Cardiomegaly without failure. 2. COPD. 3. No acute cardiopulmonary disease. Electronically Signed   By: Marin Roberts M.D.   On: 08/19/2016 18:54   Ct Head Wo Contrast  Result Date: 08/19/2016 CLINICAL DATA:  66 year old female  with multiple falls and altered mental status. EXAM: CT HEAD WITHOUT CONTRAST TECHNIQUE: Contiguous axial images were obtained from the base of the skull through the vertex without intravenous contrast.  COMPARISON:  Brain MRI dated 07/13/2016 and head CT dated 08/11/2010 FINDINGS: Brain: The ventricles and sulci appropriate in size for patient's age. Mild periventricular and deep white matter chronic microvascular ischemic changes predominantly involving the right corona radiata and centrum semiovale. There is no acute intracranial hemorrhage. No mass effect or midline shift noted. Vascular: No hyperdense vessel or unexpected calcification. Skull: Normal. Negative for fracture or focal lesion. Sinuses/Orbits: No acute finding. Other: None IMPRESSION: No acute intracranial hemorrhage. Age-related atrophy and chronic microvascular ischemic disease. If symptoms persist and there are no contraindications, MRI may provide better evaluation if clinically indicated. Electronically Signed   By: Elgie Collard M.D.   On: 08/19/2016 21:37    EKG: Independently reviewed. QTc prolonged to 586.  PACs vs. Irregular rhythm.  Repeat EKG in the AM  Assessment/Plan Severe Hypocalcemia - Cause appears to be use of denosumab (causes hypocalcemia in upwards of 18% and severe hypocalcemia in 1%) and lack of vitamin intake, also possibly poor absorption given h/o bypass surgery - I think this is likely acute on chronic, review of previous labs show low calcium and low albumin.  She has no tetany or spasms at this time.  She does have a prolonged QT on EKG which needs close monitoring.  - IV calcium 1 gm given in the ED, will give another 2 grams now - Check BMET q4 hours overnight - Magnesium normal, phos 6.1 - PTH pending, check Vitamin D - ALP normal - She reports not drinking ETOH, but interestingly her AST is 104 with normal ALT which is consistent with ETOH liver injury; possibly relates to her hallucinations and poor po intake - CIWA protocol - Recheck Mag on next BMET - Telemetry for QTc, AM EKG  Hypokalemia - Replace orally with KCl, recheck on next BMET    Bipolar 1 disorder/Depression/hallucinations -  She reports being on abilify, zoloft and celexa - Hold zoloft and celexa, continue abilify - She also reports being on lorazepam and clonazepam - held currently - CIWA protocol - Symptoms possibly related to underlying psychiatric disorder, surreptitious ETOH use (she denies).  I am not sure if hallucinations can be exacerbated by low calcium.  Monitor for improvement with calcium improvement.     Chronic edema - Hold lasix for now, monitor  CKD - Cr appears to be around baseline - IVF with NS at 125cc/hr for now given elevated CK  Elevated CK - IVF with NS at 125cc/hr for now, trend CK - UA without hemoglobin.  Renal function likely within baseline.     DVT prophylaxis: Enoxaparin Code Status: Full Family Communication: Husband at bedside Disposition Plan: Plan 1-2 day hospital stay, admit for calcium infusion Consults called: None Admission status: Inpatient, telemetry   Debe Coder MD [facility] Pager [phone]  If 7PM-7AM, please contact night-coverage [email or web address] Password [email or web address]  08/20/2016, 1:33 AM

## 2016-08-20 ENCOUNTER — Encounter (HOSPITAL_COMMUNITY): Payer: Self-pay | Admitting: General Practice

## 2016-08-20 DIAGNOSIS — F329 Major depressive disorder, single episode, unspecified: Secondary | ICD-10-CM | POA: Diagnosis present

## 2016-08-20 DIAGNOSIS — R609 Edema, unspecified: Secondary | ICD-10-CM

## 2016-08-20 DIAGNOSIS — F32A Depression, unspecified: Secondary | ICD-10-CM | POA: Diagnosis present

## 2016-08-20 DIAGNOSIS — Z888 Allergy status to other drugs, medicaments and biological substances status: Secondary | ICD-10-CM

## 2016-08-20 DIAGNOSIS — F319 Bipolar disorder, unspecified: Secondary | ICD-10-CM | POA: Diagnosis present

## 2016-08-20 DIAGNOSIS — F39 Unspecified mood [affective] disorder: Secondary | ICD-10-CM

## 2016-08-20 DIAGNOSIS — Z87891 Personal history of nicotine dependence: Secondary | ICD-10-CM

## 2016-08-20 DIAGNOSIS — R748 Abnormal levels of other serum enzymes: Secondary | ICD-10-CM

## 2016-08-20 DIAGNOSIS — Z79891 Long term (current) use of opiate analgesic: Secondary | ICD-10-CM

## 2016-08-20 DIAGNOSIS — Z79899 Other long term (current) drug therapy: Secondary | ICD-10-CM

## 2016-08-20 LAB — BASIC METABOLIC PANEL
ANION GAP: 12 (ref 5–15)
Anion gap: 10 (ref 5–15)
Anion gap: 9 (ref 5–15)
BUN: 28 mg/dL — AB (ref 6–20)
BUN: 24 mg/dL — AB (ref 6–20)
BUN: 19 mg/dL (ref 6–20)
CALCIUM: 6.7 mg/dL — AB (ref 8.9–10.3)
CALCIUM: 6.8 mg/dL — AB (ref 8.9–10.3)
CALCIUM: 6.7 mg/dL — AB (ref 8.9–10.3)
CO2: 26 mmol/L (ref 22–32)
CO2: 27 mmol/L (ref 22–32)
CO2: 22 mmol/L (ref 22–32)
CREATININE: 1.27 mg/dL — AB (ref 0.44–1.00)
CREATININE: 1.26 mg/dL — AB (ref 0.44–1.00)
Chloride: 100 mmol/L — ABNORMAL LOW (ref 101–111)
Chloride: 103 mmol/L (ref 101–111)
Chloride: 108 mmol/L (ref 101–111)
Creatinine, Ser: 0.98 mg/dL (ref 0.44–1.00)
GFR calc Af Amer: 50 mL/min — ABNORMAL LOW (ref >60)
GFR calc Af Amer: 51 mL/min — ABNORMAL LOW (ref >60)
GFR calc Af Amer: >60 mL/min (ref >60)
GFR, EST NON AFRICAN AMERICAN: 43 mL/min — AB (ref >60)
GFR, EST NON AFRICAN AMERICAN: 44 mL/min — AB (ref >60)
GFR, EST NON AFRICAN AMERICAN: 59 mL/min — AB (ref >60)
GLUCOSE: 139 mg/dL — AB (ref 65–99)
GLUCOSE: 124 mg/dL — AB (ref 65–99)
GLUCOSE: 157 mg/dL — AB (ref 65–99)
Potassium: 3.2 mmol/L — ABNORMAL LOW (ref 3.5–5.1)
Potassium: 3.1 mmol/L — ABNORMAL LOW (ref 3.5–5.1)
Potassium: 3.4 mmol/L — ABNORMAL LOW (ref 3.5–5.1)
Sodium: 138 mmol/L (ref 135–145)
Sodium: 140 mmol/L (ref 135–145)
Sodium: 139 mmol/L (ref 135–145)

## 2016-08-20 LAB — COMPREHENSIVE METABOLIC PANEL
ALBUMIN: 3.3 g/dL — AB (ref 3.5–5.0)
ALK PHOS: 69 U/L (ref 38–126)
ALT: 46 U/L (ref 14–54)
ANION GAP: 12 (ref 5–15)
AST: 98 U/L — ABNORMAL HIGH (ref 15–41)
BILIRUBIN TOTAL: 1.5 mg/dL — AB (ref 0.3–1.2)
BUN: 29 mg/dL — AB (ref 6–20)
CALCIUM: 6.8 mg/dL — AB (ref 8.9–10.3)
CO2: 27 mmol/L (ref 22–32)
Chloride: 97 mmol/L — ABNORMAL LOW (ref 101–111)
Creatinine, Ser: 1.41 mg/dL — ABNORMAL HIGH (ref 0.44–1.00)
GFR calc Af Amer: 44 mL/min — ABNORMAL LOW (ref >60)
GFR calc non Af Amer: 38 mL/min — ABNORMAL LOW (ref >60)
GLUCOSE: 111 mg/dL — AB (ref 65–99)
Potassium: 3.2 mmol/L — ABNORMAL LOW (ref 3.5–5.1)
Sodium: 136 mmol/L (ref 135–145)
TOTAL PROTEIN: 5.7 g/dL — AB (ref 6.5–8.1)

## 2016-08-20 LAB — RPR: RPR Ser Ql: NONREACTIVE

## 2016-08-20 LAB — CBC
HCT: 35.2 % — ABNORMAL LOW (ref 36.0–46.0)
Hemoglobin: 11.2 g/dL — ABNORMAL LOW (ref 12.0–15.0)
MCH: 27.9 pg (ref 26.0–34.0)
MCHC: 31.8 g/dL (ref 30.0–36.0)
MCV: 87.8 fL (ref 78.0–100.0)
PLATELETS: 254 10*3/uL (ref 150–400)
RBC: 4.01 MIL/uL (ref 3.87–5.11)
RDW: 13.6 % (ref 11.5–15.5)
WBC: 8.2 10*3/uL (ref 4.0–10.5)

## 2016-08-20 LAB — TROPONIN I
TROPONIN I: 0.03 ng/mL — AB (ref <0.03)
Troponin I: <0.03 ng/mL (ref <0.03)

## 2016-08-20 LAB — MAGNESIUM: Magnesium: 2 mg/dL (ref 1.7–2.4)

## 2016-08-20 LAB — CALCIUM, IONIZED: Calcium, Ionized, Serum: 3.1 mg/dL — ABNORMAL LOW (ref 4.5–5.6)

## 2016-08-20 LAB — CK: Total CK: 3816 U/L — ABNORMAL HIGH (ref 38–234)

## 2016-08-20 MED ORDER — POTASSIUM CHLORIDE CRYS ER 20 MEQ PO TBCR
40.0000 meq | EXTENDED_RELEASE_TABLET | ORAL | Status: AC
  Administered 2016-08-20 (×2): 40 meq via ORAL
  Filled 2016-08-20 (×2): qty 2

## 2016-08-20 MED ORDER — ARIPIPRAZOLE 10 MG PO TABS
20.0000 mg | ORAL_TABLET | Freq: Every day | ORAL | Status: DC
  Administered 2016-08-20 – 2016-08-21 (×2): 20 mg via ORAL
  Filled 2016-08-20 (×2): qty 2

## 2016-08-20 MED ORDER — LORAZEPAM 1 MG PO TABS: 1.0000 mg | ORAL_TABLET | Freq: Four times a day (QID) | ORAL | Status: DC | PRN

## 2016-08-20 MED ORDER — CYCLOBENZAPRINE HCL 10 MG PO TABS: 10.0000 mg | ORAL_TABLET | Freq: Three times a day (TID) | ORAL | Status: DC | PRN

## 2016-08-20 MED ORDER — LORAZEPAM 2 MG/ML IJ SOLN: 1.0000 mg | Freq: Four times a day (QID) | INTRAMUSCULAR | Status: DC | PRN

## 2016-08-20 MED ORDER — CALCIUM CITRATE 950 (200 CA) MG PO TABS
500.0000 mg | ORAL_TABLET | Freq: Four times a day (QID) | ORAL | Status: DC
  Administered 2016-08-20 – 2016-08-21 (×4): 500 mg via ORAL
  Filled 2016-08-20 (×7): qty 3

## 2016-08-20 MED ORDER — OXYCODONE-ACETAMINOPHEN 10-325 MG PO TABS: 1.0000 | ORAL_TABLET | ORAL | Status: DC | PRN

## 2016-08-20 MED ORDER — ADULT MULTIVITAMIN W/MINERALS CH
1.0000 | ORAL_TABLET | Freq: Every day | ORAL | Status: DC
  Administered 2016-08-20 – 2016-08-21 (×2): 1 via ORAL
  Filled 2016-08-20 (×2): qty 1

## 2016-08-20 MED ORDER — FOLIC ACID 1 MG PO TABS
1.0000 mg | ORAL_TABLET | Freq: Every day | ORAL | Status: DC
  Administered 2016-08-20 – 2016-08-21 (×2): 1 mg via ORAL
  Filled 2016-08-20 (×2): qty 1

## 2016-08-20 MED ORDER — OXYCODONE-ACETAMINOPHEN 5-325 MG PO TABS: 1.0000 | ORAL_TABLET | ORAL | Status: DC | PRN

## 2016-08-20 MED ORDER — GABAPENTIN 300 MG PO CAPS
300.0000 mg | ORAL_CAPSULE | Freq: Three times a day (TID) | ORAL | Status: DC
  Administered 2016-08-20 – 2016-08-21 (×5): 300 mg via ORAL
  Filled 2016-08-20 (×5): qty 1

## 2016-08-20 MED ORDER — FLUTICASONE PROPIONATE 50 MCG/ACT NA SUSP
2.0000 | Freq: Every day | NASAL | Status: DC
  Administered 2016-08-20 – 2016-08-21 (×2): 2 via NASAL
  Filled 2016-08-20: qty 16

## 2016-08-20 MED ORDER — LORAZEPAM 1 MG PO TABS: 1.0000 mg | ORAL_TABLET | Freq: Two times a day (BID) | ORAL | Status: DC | PRN

## 2016-08-20 MED ORDER — SODIUM CHLORIDE 0.9 % IV SOLN
INTRAVENOUS | Status: AC
  Administered 2016-08-20: 05:00:00 via INTRAVENOUS

## 2016-08-20 MED ORDER — POTASSIUM CHLORIDE CRYS ER 20 MEQ PO TBCR
60.0000 meq | EXTENDED_RELEASE_TABLET | Freq: Once | ORAL | Status: AC
  Administered 2016-08-20: 60 meq via ORAL
  Filled 2016-08-20: qty 3

## 2016-08-20 MED ORDER — VITAMIN B-1 100 MG PO TABS
100.0000 mg | ORAL_TABLET | Freq: Every day | ORAL | Status: DC
  Administered 2016-08-20 – 2016-08-21 (×2): 100 mg via ORAL
  Filled 2016-08-20 (×2): qty 1

## 2016-08-20 MED ORDER — SODIUM CHLORIDE 0.9 % IV SOLN
2.0000 g | INTRAVENOUS | Status: AC
  Administered 2016-08-20: 2 g via INTRAVENOUS
  Filled 2016-08-20: qty 20

## 2016-08-20 MED ORDER — ENOXAPARIN SODIUM 30 MG/0.3ML ~~LOC~~ SOLN
30.0000 mg | SUBCUTANEOUS | Status: DC
  Administered 2016-08-20: 30 mg via SUBCUTANEOUS
  Filled 2016-08-20: qty 0.3

## 2016-08-20 MED ORDER — VITAMIN D 1000 UNITS PO TABS
1000.0000 [IU] | ORAL_TABLET | Freq: Every day | ORAL | Status: DC
  Administered 2016-08-20 – 2016-08-21 (×2): 1000 [IU] via ORAL
  Filled 2016-08-20 (×2): qty 1

## 2016-08-20 MED ORDER — LORAZEPAM 2 MG/ML IJ SOLN
0.0000 mg | Freq: Four times a day (QID) | INTRAMUSCULAR | Status: DC
  Filled 2016-08-20: qty 2

## 2016-08-20 MED ORDER — PROMETHAZINE HCL 25 MG PO TABS
25.0000 mg | ORAL_TABLET | Freq: Four times a day (QID) | ORAL | Status: DC | PRN
Start: 2016-08-20 — End: 2016-08-21

## 2016-08-20 MED ORDER — THIAMINE HCL 100 MG/ML IJ SOLN
100.0000 mg | Freq: Every day | INTRAMUSCULAR | Status: DC
Start: 2016-08-20 — End: 2016-08-21

## 2016-08-20 MED ORDER — SODIUM CHLORIDE 0.9% FLUSH
3.0000 mL | Freq: Two times a day (BID) | INTRAVENOUS | Status: DC
  Administered 2016-08-20 – 2016-08-21 (×2): 3 mL via INTRAVENOUS

## 2016-08-20 MED ORDER — OXYCODONE HCL 5 MG PO TABS
5.0000 mg | ORAL_TABLET | ORAL | Status: DC | PRN
  Administered 2016-08-20 – 2016-08-21 (×4): 5 mg via ORAL
  Filled 2016-08-20 (×4): qty 1

## 2016-08-20 MED ORDER — LORAZEPAM 2 MG/ML IJ SOLN: 0.0000 mg | Freq: Two times a day (BID) | INTRAMUSCULAR | Status: DC

## 2016-08-20 MED ORDER — LORAZEPAM 1 MG PO TABS
1.0000 mg | ORAL_TABLET | Freq: Four times a day (QID) | ORAL | Status: DC | PRN
  Administered 2016-08-20 – 2016-08-21 (×3): 1 mg via ORAL
  Filled 2016-08-20 (×3): qty 1

## 2016-08-20 MED ORDER — CITALOPRAM HYDROBROMIDE 20 MG PO TABS: 20.0000 mg | ORAL_TABLET | Freq: Every day | ORAL | Status: DC

## 2016-08-20 MED ORDER — SODIUM CHLORIDE 0.9 % IV BOLUS (SEPSIS)
1000.0000 mL | Freq: Once | INTRAVENOUS | Status: AC
  Administered 2016-08-20: 1000 mL via INTRAVENOUS

## 2016-08-20 MED ORDER — ALBUTEROL SULFATE (2.5 MG/3ML) 0.083% IN NEBU: 3.0000 mL | INHALATION_SOLUTION | Freq: Four times a day (QID) | RESPIRATORY_TRACT | Status: DC | PRN

## 2016-08-20 NOTE — ED Notes (Signed)
Pt assisted with bed pan

## 2016-08-20 NOTE — Progress Notes (Signed)
PROGRESS NOTE    ALLIX BLOMQUIST  VHQ:[id number] DOB: March 04, 1951 DOA: 08/19/2016 PCP: Cala Bradford, MD   Brief Narrative:  Maie Kesinger is a 66 year old female history of depression, bipolar disorder, chronic alcohol use, ADHD, chronic back pain, anxiety presented with falls and hallucinations and noted to be severely hypocalcemic, hypokalemic. Patient also noted to have a elevated CK level. Psychiatry consulted.   Assessment & Plan:   Principal Problem:   Bipolar 1 disorder (HCC) Active Problems:   Hypocalcemia   Depression   Chronic edema  #1 bipolar disorder/depression/hallucinations Patient presented with increased falls and hallucinations. Patient noted to be hypocalcemic. Patient also noted to have elevated CK which may have been secondary to patient's Abilify. Patient currently denies any further hallucinations. Continue Abilify. Continue to hold Celexa and Zoloft. Psychiatric consultation pending.  #2 severe hypocalcemia Likely secondary to denosumab lack of vitamin D intake and possibly poor absorption secondary to history of bypass surgery. Patient with no technical spasms. Patient with prolonged QTC on EKG. Patient status post IV calcium gluconate on admission in the ED and on the floor. Magnesium level is normal. Phosphorus elevated at 6.1. Vitamin D, PTH pending. Continue the Ativan CIWA protocol. Place on calcium citrate 500 mg by mouth 4 times a day and vitamin D 1000 international units daily. Serial B meds. Follow.  #3 hypokalemia Replete.  #4 elevated CK Questionable etiology. May be secondary to falls versus secondary to Abilify. Cycle troponins. CK levels at 3816. Hydrate aggressively with IV fluids and monitor urine output. Repeat CK levels in the morning.  #5 chronic kidney disease Stable. Monitor with hydration.   DVT prophylaxis: Lovenox Code Status: Full Family Communication: Updated patient. No family at bedside. Disposition Plan: Pending PT  evaluation, psychiatric evaluation and improvement with hypocalcemia and elevated CK levels.   Consultants:   Psychiatry pending  Procedures:   CT at 08/19/2016  Chest x-ray 08/19/2016  Antimicrobials:   None   Subjective: Patient states she's feeling well today. Patient denies any chest pain. No shortness of breath. No hallucinations.  Objective: Vitals:   08/20/16 0351 08/20/16 0356 08/20/16 0423 08/20/16 0708  BP: 109/70 (!) 85/68  91/61  Pulse: 65 82  60  Resp: 18 20  20   Temp: 98.6 F (37 C) 98.7 F (37.1 C)  98.5 F (36.9 C)  TempSrc:    Oral  SpO2: 99% 99%  100%  Height:   5\' 3"  (1.6 m)     Intake/Output Summary (Last 24 hours) at 08/20/16 1152 Last data filed at 08/20/16 0700  Gross per 24 hour  Intake           629.17 ml  Output                0 ml  Net           629.17 ml   There were no vitals filed for this visit.  Examination:  General exam: Appears calm and comfortable.Thin, frail.  Respiratory system: Clear to auscultation. Respiratory effort normal. Cardiovascular system: S1 & S2 heard, RRR. No JVD, murmurs, rubs, gallops or clicks. No pedal edema. Gastrointestinal system: Abdomen is nondistended, soft and nontender. No organomegaly or masses felt. Normal bowel sounds heard. Central nervous system: Alert and oriented. No focal neurological deficits. Extremities: Symmetric 5 x 5 power. Skin: Multiple areas of bruises on legs and arms. Psychiatry: Judgement and insight appear normal. Mood & affect appropriate.     Data Reviewed: I have personally reviewed following labs  and imaging studies  CBC:  Recent Labs Lab 08/19/16 1750 08/20/16 0408  WBC 9.3 8.2  NEUTROABS 7.5  --   HGB 10.8* 11.2*  HCT 34.9* 35.2*  MCV 89.5 87.8  PLT 140* 254   Basic Metabolic Panel:  Recent Labs Lab 08/19/16 1750 08/19/16 2027 08/19/16 2250 08/20/16 0408 08/20/16 0700  NA 137  --  136 136 138  K 4.1  --  3.0* 3.2* 3.2*  CL 101  --  98* 97* 100*   CO2 19*  --  23 27 26   GLUCOSE 71  --  123* 111* 139*  BUN 42*  --  36* 29* 28*  CREATININE 1.89*  --  1.70* 1.41* 1.27*  CALCIUM 5.5*  --  6.0* 6.8* 6.7*  MG  --  2.0  --  2.0  --   PHOS  --  6.1*  --   --   --    GFR: CrCl cannot be calculated (Unknown ideal weight.). Liver Function Tests:  Recent Labs Lab 08/19/16 1750 08/20/16 0408  AST 104* 98*  ALT 40 46  ALKPHOS 66 69  BILITOT 2.1* 1.5*  PROT 5.7* 5.7*  ALBUMIN 3.2* 3.3*   No results for input(s): LIPASE, AMYLASE in the last 168 hours.  Recent Labs Lab 08/19/16 1817  AMMONIA 22   Coagulation Profile:  Recent Labs Lab 08/19/16 1817  INR 1.45   Cardiac Enzymes:  Recent Labs Lab 08/19/16 2027 08/20/16 0408 08/20/16 0816  CKTOTAL 3,777* 3,816*  --   TROPONINI  --   --  <0.03   BNP (last 3 results) No results for input(s): PROBNP in the last 8760 hours. HbA1C: No results for input(s): HGBA1C in the last 72 hours. CBG:  Recent Labs Lab 08/19/16 1742  GLUCAP 71   Lipid Profile: No results for input(s): CHOL, HDL, LDLCALC, TRIG, CHOLHDL, LDLDIRECT in the last 72 hours. Thyroid Function Tests:  Recent Labs  08/19/16 1817  TSH 1.233   Anemia Panel: No results for input(s): VITAMINB12, FOLATE, FERRITIN, TIBC, IRON, RETICCTPCT in the last 72 hours. Sepsis Labs:  Recent Labs Lab 08/19/16 1838  LATICACIDVEN 0.73    No results found for this or any previous visit (from the past 240 hour(s)).       Radiology Studies: Dg Chest 2 View  Result Date: 08/19/2016 CLINICAL DATA:  Fall.  Altered mental status.  Hallucinations. EXAM: CHEST  2 VIEW COMPARISON:  Chest and rib radiographs 06/18/2016 FINDINGS: The heart is mildly enlarged. Changes of COPD are again noted. There is no focal airspace disease. No acute fracture or pneumothorax is present. Degenerative changes are present at the shoulders. IMPRESSION: 1. Cardiomegaly without failure. 2. COPD. 3. No acute cardiopulmonary disease.  Electronically Signed   By: Marin Robertshristopher  Mattern M.D.   On: 08/19/2016 18:54   Ct Head Wo Contrast  Result Date: 08/19/2016 CLINICAL DATA:  66 year old female with multiple falls and altered mental status. EXAM: CT HEAD WITHOUT CONTRAST TECHNIQUE: Contiguous axial images were obtained from the base of the skull through the vertex without intravenous contrast. COMPARISON:  Brain MRI dated 07/13/2016 and head CT dated 08/11/2010 FINDINGS: Brain: The ventricles and sulci appropriate in size for patient's age. Mild periventricular and deep white matter chronic microvascular ischemic changes predominantly involving the right corona radiata and centrum semiovale. There is no acute intracranial hemorrhage. No mass effect or midline shift noted. Vascular: No hyperdense vessel or unexpected calcification. Skull: Normal. Negative for fracture or focal lesion. Sinuses/Orbits: No acute finding.  Other: None IMPRESSION: No acute intracranial hemorrhage. Age-related atrophy and chronic microvascular ischemic disease. If symptoms persist and there are no contraindications, MRI may provide better evaluation if clinically indicated. Electronically Signed   By: Elgie Collard M.D.   On: 08/19/2016 21:37        Scheduled Meds: . ARIPiprazole  20 mg Oral Daily  . calcium citrate  500 mg of elemental calcium Oral QID  . cholecalciferol  1,000 Units Oral Daily  . enoxaparin (LOVENOX) injection  30 mg Subcutaneous Q24H  . fluticasone  2 spray Each Nare Daily  . folic acid  1 mg Oral Daily  . gabapentin  300 mg Oral TID  . LORazepam  0-4 mg Intravenous Q6H   Followed by  . [START ON 08/22/2016] LORazepam  0-4 mg Intravenous Q12H  . multivitamin with minerals  1 tablet Oral Daily  . potassium chloride  40 mEq Oral Q4H  . sodium chloride  1,000 mL Intravenous Once  . sodium chloride flush  3 mL Intravenous Q12H  . thiamine  100 mg Oral Daily   Or  . thiamine  100 mg Intravenous Daily   Continuous Infusions: .  sodium chloride 125 mL/hr at 08/20/16 0700     LOS: 1 day    Time spent: 40 minutes    Lennon Richins, MD [facility] Pager [phone]  If 7PM-7AM, please contact night-coverage [email or web address] Password [facility] 08/20/2016, 11:52 AM

## 2016-08-20 NOTE — Consult Note (Signed)
[name]   Reason for Consult:  Psychosis and hypocalcemia Referring Physician:  Dr. Grandville Silos Patient Identification: [name] MRN:  [id number] Principal Diagnosis: Bipolar 1 disorder [facility]) Diagnosis:   Patient Active Problem List   Diagnosis Date Noted  . Bipolar 1 disorder ([place]) [F31.9]   . Depression [F32.9]   . Chronic edema [R60.9]   . Mood disorder ([place]) [F39]   . Hypocalcemia [E83.51] 08/19/2016  . Palpitations [R00.2] 10/01/2010  . Hypertension [I10] 10/01/2010    Total Time spent with patient: 1 hour  Subjective:   [name] is a 67 y.o. female patient admitted with mood changes and hallucinations.  HPI:  [name] is a 66 y.o. female, Seen, chart reviewed for this face-to-face psychiatric consultation and evaluation. Patient has been suffering with hypocalcemia and osteoporosis. Reportedly she has mood swings, jerking movements and frequent falling at home required this hospitalization. Patient has been diagnosed with bipolar disorder, attention deficit Disorder and Also Alcohol Dependence in Remission. Patient Denies Current Symptoms of Depression, Anxiety, Racing Thoughts, Disturbed Sleep and Appetite and No Relapse in Drug of Abuse. Patient Also Contract for Safety during This Hospitalization. Hypocalcemic can increase mood changes, anxiety but does not have much effect on hallucinations. Patient had hallucinations of seeing her mother-in-law after she left the hospital for 1 time, and denied previous hallucinations or ongoing hallucinations. Patient appeared not responding to internal stimuli during my evaluation. Patient hoping that she can go home once medically stable and hypocalcemia has been corrected during this hospitalization. Patient hypocalcemia has been responding to her calcium treatment. Patient has a Occupational hygienist and a dog which she is caring for at home. She also has a friend send extended family members who can  support her as needed. Patient does not meet criteria for acute psychiatric hospitalization and will be referred to the outpatient medication management at [facility].  Past Psychiatric History: Bipolar disorder, attention deficit hyperactive disorder, history of alcohol dependence and currently in remission. Patient has previous acute psychiatric hospitalization for detox treatment in 2010 at [facility].  Risk to Self: Is patient at risk for suicide?: No Risk to Others:   Prior Inpatient Therapy:   Prior Outpatient Therapy:    Past Medical History:  Past Medical History:  Diagnosis Date  . Attention deficit disorder   . Bipolar 1 disorder ([place])   . Chronic alcohol abuse   . Chronic edema    LOWER EXTREMITY  . Chronic hypokalemia   . Depression   . Hyperactivity disorder   . Hypoalbuminemia   . Iron deficiency anemia   . Malnutrition ([place])   . Mood disorder ([place])   . Wolff-Parkinson-White syndrome    status post ablation x 2    Past Surgical History:  Procedure Laterality Date  . GASTRIC BYPASS    . gastric bypass sur    . LAPAROSCOPIC GASTROTOMY W/ REPAIR OF ULCER    . LAPAROTOMY     Family History:  Family History  Problem Relation Age of Onset  . Other Father 69    cardiac death  . Breast cancer Mother     deceased  . Diabetes Brother    Family Psychiatric  History: Denied Social History:  History  Alcohol Use No    Comment: pt denies     History  Drug Use No    Comment: marijuana    Social History   Social History  . Marital status: Married    Spouse  name: N/A  . Number of children: N/A  . Years of education: N/A   Social History Main Topics  . Smoking status: Former Smoker    Types: Cigarettes    Quit date: 06/11/1988  . Smokeless tobacco: Never Used  . Alcohol use No     Comment: pt denies  . Drug use: No     Comment: marijuana  . Sexual activity: Not Asked   Other Topics Concern  . None   Social History  Narrative  . None   Additional Social History:    Allergies:   Allergies  Allergen Reactions  . Prednisone     Makes me crazy     Labs:  Results for orders placed or performed during the hospital encounter of 08/19/16 (from the past 48 hour(s))  CBG monitoring, ED     Status: None   Collection Time: 08/19/16  5:42 PM  Result Value Ref Range   Glucose-Capillary 71 65 - 99 mg/dL  Comprehensive metabolic panel     Status: Abnormal   Collection Time: 08/19/16  5:50 PM  Result Value Ref Range   Sodium 137 135 - 145 mmol/L   Potassium 4.1 3.5 - 5.1 mmol/L   Chloride 101 101 - 111 mmol/L   CO2 19 (L) 22 - 32 mmol/L   Glucose, Bld 71 65 - 99 mg/dL   BUN 42 (H) 6 - 20 mg/dL   Creatinine, Ser 1.89 (H) 0.44 - 1.00 mg/dL   Calcium 5.5 (LL) 8.9 - 10.3 mg/dL    Comment: CRITICAL RESULT CALLED TO, READ BACK BY AND VERIFIED WITH: C.PRICE,RN 08/19/16 @1908  BY V.WILKINS    Total Protein 5.7 (L) 6.5 - 8.1 g/dL   Albumin 3.2 (L) 3.5 - 5.0 g/dL   AST 104 (H) 15 - 41 U/L   ALT 40 14 - 54 U/L   Alkaline Phosphatase 66 38 - 126 U/L   Total Bilirubin 2.1 (H) 0.3 - 1.2 mg/dL   GFR calc non Af Amer 27 (L) >60 mL/min   GFR calc Af Amer 31 (L) >60 mL/min    Comment: (NOTE) The eGFR has been calculated using the CKD EPI equation. This calculation has not been validated in all clinical situations. eGFR's persistently <60 mL/min signify possible Chronic Kidney Disease.    Anion gap 17 (H) 5 - 15  CBC     Status: Abnormal   Collection Time: 08/19/16  5:50 PM  Result Value Ref Range   WBC 9.3 4.0 - 10.5 K/uL   RBC 3.90 3.87 - 5.11 MIL/uL   Hemoglobin 10.8 (L) 12.0 - 15.0 g/dL   HCT 34.9 (L) 36.0 - 46.0 %   MCV 89.5 78.0 - 100.0 fL   MCH 27.7 26.0 - 34.0 pg   MCHC 30.9 30.0 - 36.0 g/dL   RDW 13.7 11.5 - 15.5 %   Platelets 140 (L) 150 - 400 K/uL  Differential     Status: None   Collection Time: 08/19/16  5:50 PM  Result Value Ref Range   Neutrophils Relative % 80 %   Neutro Abs 7.5 1.7 -  7.7 K/uL   Lymphocytes Relative 14 %   Lymphs Abs 1.3 0.7 - 4.0 K/uL   Monocytes Relative 6 %   Monocytes Absolute 0.6 0.1 - 1.0 K/uL   Eosinophils Relative 0 %   Eosinophils Absolute 0.0 0.0 - 0.7 K/uL   Basophils Relative 0 %   Basophils Absolute 0.0 0.0 - 0.1 K/uL  Ammonia  Status: None   Collection Time: 08/19/16  6:17 PM  Result Value Ref Range   Ammonia 22 9 - 35 umol/L  Protime-INR     Status: Abnormal   Collection Time: 08/19/16  6:17 PM  Result Value Ref Range   Prothrombin Time 17.8 (H) 11.4 - 15.2 seconds   INR 1.45   Ethanol     Status: None   Collection Time: 08/19/16  6:17 PM  Result Value Ref Range   Alcohol, Ethyl (B) <5 <5 mg/dL    Comment:        LOWEST DETECTABLE LIMIT FOR SERUM ALCOHOL IS 5 mg/dL FOR MEDICAL PURPOSES ONLY   Salicylate level     Status: None   Collection Time: 08/19/16  6:17 PM  Result Value Ref Range   Salicylate Lvl <3.8 2.8 - 30.0 mg/dL  Acetaminophen level     Status: None   Collection Time: 08/19/16  6:17 PM  Result Value Ref Range   Acetaminophen (Tylenol), Serum 14 10 - 30 ug/mL    Comment:        THERAPEUTIC CONCENTRATIONS VARY SIGNIFICANTLY. A RANGE OF 10-30 ug/mL MAY BE AN EFFECTIVE CONCENTRATION FOR MANY PATIENTS. HOWEVER, SOME ARE BEST TREATED AT CONCENTRATIONS OUTSIDE THIS RANGE. ACETAMINOPHEN CONCENTRATIONS >150 ug/mL AT 4 HOURS AFTER INGESTION AND >50 ug/mL AT 12 HOURS AFTER INGESTION ARE OFTEN ASSOCIATED WITH TOXIC REACTIONS.   TSH     Status: None   Collection Time: 08/19/16  6:17 PM  Result Value Ref Range   TSH 1.233 0.350 - 4.500 uIU/mL    Comment: Performed by a 3rd Generation assay with a functional sensitivity of <=0.01 uIU/mL.  I-stat troponin, ED     Status: None   Collection Time: 08/19/16  6:36 PM  Result Value Ref Range   Troponin i, poc 0.00 0.00 - 0.08 ng/mL   Comment 3            Comment: Due to the release kinetics of cTnI, a negative result within the first hours of the onset of  symptoms does not rule out myocardial infarction with certainty. If myocardial infarction is still suspected, repeat the test at appropriate intervals.   I-Stat CG4 Lactic Acid, ED     Status: None   Collection Time: 08/19/16  6:38 PM  Result Value Ref Range   Lactic Acid, Venous 0.73 0.5 - 1.9 mmol/L  Rapid urine drug screen (hospital performed)     Status: Abnormal   Collection Time: 08/19/16  6:53 PM  Result Value Ref Range   Opiates POSITIVE (A) NONE DETECTED   Cocaine NONE DETECTED NONE DETECTED   Benzodiazepines NONE DETECTED NONE DETECTED   Amphetamines NONE DETECTED NONE DETECTED   Tetrahydrocannabinol NONE DETECTED NONE DETECTED   Barbiturates NONE DETECTED NONE DETECTED    Comment:        DRUG SCREEN FOR MEDICAL PURPOSES ONLY.  IF CONFIRMATION IS NEEDED FOR ANY PURPOSE, NOTIFY LAB WITHIN 5 DAYS.        LOWEST DETECTABLE LIMITS FOR URINE DRUG SCREEN Drug Class       Cutoff (ng/mL) Amphetamine      1000 Barbiturate      200 Benzodiazepine   453 Tricyclics       646 Opiates          300 Cocaine          300 THC              50   Magnesium  Status: None   Collection Time: 08/19/16  8:27 PM  Result Value Ref Range   Magnesium 2.0 1.7 - 2.4 mg/dL  CK     Status: Abnormal   Collection Time: 08/19/16  8:27 PM  Result Value Ref Range   Total CK 3,777 (H) 38 - 234 U/L  Phosphorus     Status: Abnormal   Collection Time: 08/19/16  8:27 PM  Result Value Ref Range   Phosphorus 6.1 (H) 2.5 - 4.6 mg/dL  Urinalysis, Routine w reflex microscopic     Status: Abnormal   Collection Time: 08/19/16 10:46 PM  Result Value Ref Range   Color, Urine YELLOW YELLOW   APPearance CLEAR CLEAR   Specific Gravity, Urine 1.010 1.005 - 1.030   pH 5.0 5.0 - 8.0   Glucose, UA NEGATIVE NEGATIVE mg/dL   Hgb urine dipstick NEGATIVE NEGATIVE   Bilirubin Urine NEGATIVE NEGATIVE   Ketones, ur NEGATIVE NEGATIVE mg/dL   Protein, ur NEGATIVE NEGATIVE mg/dL   Nitrite NEGATIVE NEGATIVE    Leukocytes, UA MODERATE (A) NEGATIVE   RBC / HPF 0-5 0 - 5 RBC/hpf   WBC, UA 0-5 0 - 5 WBC/hpf   Bacteria, UA NONE SEEN NONE SEEN   Squamous Epithelial / LPF 0-5 (A) NONE SEEN   Mucous PRESENT    Hyaline Casts, UA PRESENT   Basic metabolic panel     Status: Abnormal   Collection Time: 08/19/16 10:50 PM  Result Value Ref Range   Sodium 136 135 - 145 mmol/L   Potassium 3.0 (L) 3.5 - 5.1 mmol/L    Comment: DELTA CHECK NOTED   Chloride 98 (L) 101 - 111 mmol/L   CO2 23 22 - 32 mmol/L   Glucose, Bld 123 (H) 65 - 99 mg/dL   BUN 36 (H) 6 - 20 mg/dL   Creatinine, Ser 1.70 (H) 0.44 - 1.00 mg/dL   Calcium 6.0 (LL) 8.9 - 10.3 mg/dL    Comment: CRITICAL RESULT CALLED TO, READ BACK BY AND VERIFIED WITH: PRICE,C RN 08/19/2016 2327 JORDANS    GFR calc non Af Amer 30 (L) >60 mL/min   GFR calc Af Amer 35 (L) >60 mL/min    Comment: (NOTE) The eGFR has been calculated using the CKD EPI equation. This calculation has not been validated in all clinical situations. eGFR's persistently <60 mL/min signify possible Chronic Kidney Disease.    Anion gap 15 5 - 15  CBC     Status: Abnormal   Collection Time: 08/20/16  4:08 AM  Result Value Ref Range   WBC 8.2 4.0 - 10.5 K/uL   RBC 4.01 3.87 - 5.11 MIL/uL   Hemoglobin 11.2 (L) 12.0 - 15.0 g/dL   HCT 35.2 (L) 36.0 - 46.0 %   MCV 87.8 78.0 - 100.0 fL   MCH 27.9 26.0 - 34.0 pg   MCHC 31.8 30.0 - 36.0 g/dL   RDW 13.6 11.5 - 15.5 %   Platelets 254 150 - 400 K/uL  Comprehensive metabolic panel     Status: Abnormal   Collection Time: 08/20/16  4:08 AM  Result Value Ref Range   Sodium 136 135 - 145 mmol/L   Potassium 3.2 (L) 3.5 - 5.1 mmol/L   Chloride 97 (L) 101 - 111 mmol/L   CO2 27 22 - 32 mmol/L   Glucose, Bld 111 (H) 65 - 99 mg/dL   BUN 29 (H) 6 - 20 mg/dL   Creatinine, Ser 1.41 (H) 0.44 - 1.00 mg/dL   Calcium  6.8 (L) 8.9 - 10.3 mg/dL   Total Protein 5.7 (L) 6.5 - 8.1 g/dL   Albumin 3.3 (L) 3.5 - 5.0 g/dL   AST 98 (H) 15 - 41 U/L   ALT 46 14  - 54 U/L   Alkaline Phosphatase 69 38 - 126 U/L   Total Bilirubin 1.5 (H) 0.3 - 1.2 mg/dL   GFR calc non Af Amer 38 (L) >60 mL/min   GFR calc Af Amer 44 (L) >60 mL/min    Comment: (NOTE) The eGFR has been calculated using the CKD EPI equation. This calculation has not been validated in all clinical situations. eGFR's persistently <60 mL/min signify possible Chronic Kidney Disease.    Anion gap 12 5 - 15  Magnesium     Status: None   Collection Time: 08/20/16  4:08 AM  Result Value Ref Range   Magnesium 2.0 1.7 - 2.4 mg/dL  CK     Status: Abnormal   Collection Time: 08/20/16  4:08 AM  Result Value Ref Range   Total CK 3,816 (H) 38 - 234 U/L  Basic metabolic panel     Status: Abnormal   Collection Time: 08/20/16  7:00 AM  Result Value Ref Range   Sodium 138 135 - 145 mmol/L   Potassium 3.2 (L) 3.5 - 5.1 mmol/L   Chloride 100 (L) 101 - 111 mmol/L   CO2 26 22 - 32 mmol/L   Glucose, Bld 139 (H) 65 - 99 mg/dL   BUN 28 (H) 6 - 20 mg/dL   Creatinine, Ser 1.27 (H) 0.44 - 1.00 mg/dL   Calcium 6.7 (L) 8.9 - 10.3 mg/dL   GFR calc non Af Amer 43 (L) >60 mL/min   GFR calc Af Amer 50 (L) >60 mL/min    Comment: (NOTE) The eGFR has been calculated using the CKD EPI equation. This calculation has not been validated in all clinical situations. eGFR's persistently <60 mL/min signify possible Chronic Kidney Disease.    Anion gap 12 5 - 15  Troponin I (q 6hr x 3)     Status: None   Collection Time: 08/20/16  8:16 AM  Result Value Ref Range   Troponin I <0.03 <0.03 ng/mL    Current Facility-Administered Medications  Medication Dose Route Frequency Provider Last Rate Last Dose  . 0.9 %  sodium chloride infusion   Intravenous Continuous Sid Falcon, MD 125 mL/hr at 08/20/16 0700    . albuterol (PROVENTIL) (2.5 MG/3ML) 0.083% nebulizer solution 3 mL  3 mL Inhalation Q6H PRN Sid Falcon, MD      . ARIPiprazole (ABILIFY) tablet 20 mg  20 mg Oral Daily Sid Falcon, MD      . calcium  citrate (CALCITRATE - dosed in mg elemental calcium) tablet 500 mg of elemental calcium  500 mg of elemental calcium Oral QID Eugenie Filler, MD      . cholecalciferol (VITAMIN D) tablet 1,000 Units  1,000 Units Oral Daily Irine Seal V, MD      . cyclobenzaprine (FLEXERIL) tablet 10 mg  10 mg Oral TID PRN Eugenie Filler, MD      . enoxaparin (LOVENOX) injection 30 mg  30 mg Subcutaneous Q24H Sid Falcon, MD      . fluticasone (FLONASE) 50 MCG/ACT nasal spray 2 spray  2 spray Each Nare Daily Sid Falcon, MD      . folic acid (FOLVITE) tablet 1 mg  1 mg Oral Daily Sid Falcon, MD      .  gabapentin (NEURONTIN) capsule 300 mg  300 mg Oral TID Sid Falcon, MD   300 mg at 08/20/16 0646  . LORazepam (ATIVAN) injection 0-4 mg  0-4 mg Intravenous Q6H Eugenie Filler, MD       Followed by  . [START ON 08/22/2016] LORazepam (ATIVAN) injection 0-4 mg  0-4 mg Intravenous Q12H Eugenie Filler, MD      . LORazepam (ATIVAN) tablet 1 mg  1 mg Oral Q6H PRN Eugenie Filler, MD       Or  . LORazepam (ATIVAN) injection 1 mg  1 mg Intravenous Q6H PRN Eugenie Filler, MD      . multivitamin with minerals tablet 1 tablet  1 tablet Oral Daily Sid Falcon, MD      . oxyCODONE-acetaminophen (PERCOCET/ROXICET) 5-325 MG per tablet 1 tablet  1 tablet Oral Q4H PRN Sid Falcon, MD       And  . oxyCODONE (Oxy IR/ROXICODONE) immediate release tablet 5 mg  5 mg Oral Q4H PRN Sid Falcon, MD   5 mg at 08/20/16 0646  . potassium chloride SA (K-DUR,KLOR-CON) CR tablet 40 mEq  40 mEq Oral Q4H Eugenie Filler, MD      . promethazine (PHENERGAN) tablet 25 mg  25 mg Oral Q6H PRN Sid Falcon, MD      . sodium chloride 0.9 % bolus 1,000 mL  1,000 mL Intravenous Once Irine Seal V, MD      . sodium chloride flush (NS) 0.9 % injection 3 mL  3 mL Intravenous Q12H Sid Falcon, MD      . thiamine (VITAMIN B-1) tablet 100 mg  100 mg Oral Daily Sid Falcon, MD       Or  . thiamine (B-1)  injection 100 mg  100 mg Intravenous Daily Sid Falcon, MD        Musculoskeletal: Strength & Muscle Tone: within normal limits Gait & Station: normal, unable to stand Patient leans: N/A  Psychiatric Specialty Exam: Physical Exam as per history and physical   ROS more frequent jerking movements feels like having seizures and frequent falls and multiple bruises on her upper extremities and lower extremities.  No Fever-chills, No Headache, No changes with Vision or hearing, reports vertigo No problems swallowing food or Liquids, No Chest pain, Cough or Shortness of Breath, No Abdominal pain, No Nausea or Vommitting, Bowel movements are regular, No Blood in stool or Urine, No dysuria, No new skin rashes or bruises, No new joints pains-aches,  No new weakness, tingling, numbness in any extremity, No recent weight gain or loss, No polyuria, polydypsia or polyphagia,  A full 10 point Review of Systems was done, except as stated above, all other Review of Systems were negative.  Blood pressure 91/61, pulse 60, temperature 98.5 F (36.9 C), temperature source Oral, resp. rate 20, height 5' 3"  (1.6 m), SpO2 100 %.There is no height or weight on file to calculate BMI.  General Appearance: Guarded  Eye Contact:  Good  Speech:  Clear and Coherent  Volume:  Normal  Mood:  Anxious  Affect:  Appropriate and Congruent  Thought Process:  Coherent and Goal Directed  Orientation:  Full (Time, Place, and Person)  Thought Content:  Logical  Suicidal Thoughts:  No  Homicidal Thoughts:  No  Memory:  Immediate;   Fair Recent;   Fair Remote;   Poor  Judgement:  Intact  Insight:  Fair  Psychomotor Activity:  Decreased  Concentration:  Concentration: Fair and Attention Span: Fair  Recall:  Good  Fund of Knowledge:  Good  Language:  Good  Akathisia:  Negative  Handed:  Right  AIMS (if indicated):     Assets:  Communication Skills Desire for Improvement Financial  Resources/Insurance Housing Leisure Time Resilience Social Support Transportation Vocational/Educational  ADL's:  Intact  Cognition:  WNL  Sleep:        Treatment Plan Summary: 66 years old female who lives by herself and taking care of her 2 pets at home and says best friends, husband who lives across and also has a mother-in-law and father-in-law lives close by who support her. Patient presented with hypocalcemia and jerking movements and reportedly suffering with history of bipolar disorder, ADHD and alcohol dependence in remission. Her last alcohol use was 3 years ago.  Patient has no acute symptoms of depression, mania and contract for safety without suicidal or homicidal ideation. Patient does not appear to be responding to internal stimuli and denies current activities/visual hallucinations, delusions and paranoia.  Patient will continue her home medication Abilify 20 mg daily for mood swings, lorazepam for anxiety as needed, gabapentin 300 mg 3 times daily for chronic pain syndrome   Disposition: No evidence of imminent risk to self or others at present.   Patient does not meet criteria for psychiatric inpatient admission. Supportive therapy provided about ongoing stressors.  Ambrose Finland, MD 08/20/2016 9:27 AM

## 2016-08-21 LAB — CBC
HEMATOCRIT: 32.2 % — AB (ref 36.0–46.0)
Hemoglobin: 10.3 g/dL — ABNORMAL LOW (ref 12.0–15.0)
MCH: 28.8 pg (ref 26.0–34.0)
MCHC: 32 g/dL (ref 30.0–36.0)
MCV: 89.9 fL (ref 78.0–100.0)
Platelets: 199 10*3/uL (ref 150–400)
RBC: 3.58 MIL/uL — ABNORMAL LOW (ref 3.87–5.11)
RDW: 14.3 % (ref 11.5–15.5)
WBC: 5.1 10*3/uL (ref 4.0–10.5)

## 2016-08-21 LAB — COMPREHENSIVE METABOLIC PANEL
ALK PHOS: 55 U/L (ref 38–126)
ALT: 40 U/L (ref 14–54)
AST: 57 U/L — ABNORMAL HIGH (ref 15–41)
Albumin: 2.6 g/dL — ABNORMAL LOW (ref 3.5–5.0)
Anion gap: 7 (ref 5–15)
BILIRUBIN TOTAL: 1.2 mg/dL (ref 0.3–1.2)
BUN: 12 mg/dL (ref 6–20)
CALCIUM: 7.2 mg/dL — AB (ref 8.9–10.3)
CO2: 26 mmol/L (ref 22–32)
Chloride: 109 mmol/L (ref 101–111)
Creatinine, Ser: 0.74 mg/dL (ref 0.44–1.00)
GFR calc Af Amer: >60 mL/min (ref >60)
Glucose, Bld: 113 mg/dL — ABNORMAL HIGH (ref 65–99)
POTASSIUM: 3.2 mmol/L — AB (ref 3.5–5.1)
Sodium: 142 mmol/L (ref 135–145)
TOTAL PROTEIN: 4.8 g/dL — AB (ref 6.5–8.1)

## 2016-08-21 LAB — GLUCOSE, CAPILLARY: GLUCOSE-CAPILLARY: 110 mg/dL — AB (ref 65–99)

## 2016-08-21 LAB — MAGNESIUM: Magnesium: 1.8 mg/dL (ref 1.7–2.4)

## 2016-08-21 LAB — URINE CULTURE: CULTURE: NO GROWTH

## 2016-08-21 LAB — PARATHYROID HORMONE, INTACT (NO CA): PTH: 225 pg/mL — ABNORMAL HIGH (ref 15–65)

## 2016-08-21 LAB — CALCIUM, URINE, RANDOM: CALCIUM UR: 1.1 mg/dL

## 2016-08-21 LAB — CK: Total CK: 1822 U/L — ABNORMAL HIGH (ref 38–234)

## 2016-08-21 LAB — VITAMIN D 25 HYDROXY (VIT D DEFICIENCY, FRACTURES): VIT D 25 HYDROXY: 30.6 ng/mL (ref 30.0–100.0)

## 2016-08-21 MED ORDER — CHOLECALCIFEROL 25 MCG (1000 UT) PO TABS: 1000.0000 [IU] | ORAL_TABLET | Freq: Every day | ORAL | Status: AC

## 2016-08-21 MED ORDER — POTASSIUM CHLORIDE CRYS ER 20 MEQ PO TBCR
40.0000 meq | EXTENDED_RELEASE_TABLET | ORAL | Status: AC
  Administered 2016-08-21 (×2): 40 meq via ORAL
  Filled 2016-08-21 (×2): qty 2

## 2016-08-21 MED ORDER — WHITE PETROLATUM GEL
Status: AC
  Administered 2016-08-21: 08:00:00
  Filled 2016-08-21: qty 1

## 2016-08-21 MED ORDER — SODIUM CHLORIDE 0.9 % IV SOLN: INTRAVENOUS | Status: DC

## 2016-08-21 MED ORDER — FOLIC ACID 1 MG PO TABS: 1.0000 mg | ORAL_TABLET | Freq: Every day | ORAL | Status: AC

## 2016-08-21 MED ORDER — THIAMINE HCL 100 MG PO TABS: 100.0000 mg | ORAL_TABLET | Freq: Every day | ORAL | Status: AC

## 2016-08-21 MED ORDER — CALCIUM CITRATE 950 (200 CA) MG PO TABS: 500.0000 mg | ORAL_TABLET | Freq: Four times a day (QID) | ORAL | Status: AC

## 2016-08-21 NOTE — Discharge Summary (Signed)
Physician Discharge Summary  [name] ZOX:[id number] DOB: 08-06-1950 DOA: 08/19/2016  PCP: Cala Bradford, MD  Admit date: 08/19/2016 Discharge date: 08/21/2016  Time spent: 65 minutes  Recommendations for Outpatient Follow-up:  1. Follow-up with Cala Bradford, MD in 2 weeks. On follow-up patient will need a basic metabolic profile done to follow-up on electrolytes, renal function, calcium levels. Patient's Prolia will likely need to be discontinued. Repeat EKG also needs to be obtained to follow-up on QTc prolongation.   Discharge Diagnoses:  Principal Problem:   Bipolar 1 disorder (HCC) Active Problems:   Hypocalcemia   Depression   Chronic edema   Hypercalcemia   Elevated CK   Discharge Condition: Stable and improved  Diet recommendation: Heart healthy  There were no vitals filed for this visit.  History of present illness:  Per Dr Deno Etienne is a 66 y.o. female with medical history significant of Depression, chronic ETOH current sobriety, bipolar 1 disorder, ADHD, chronic back pain, anxiety who presented for falling and hallucinations.  Per discussion with patient and husband in room, she had been having frequent falls for at least the past month and longer. She reported these falls as "jerking" that "throws me across the room" and she falls.  She had multiple bruises on legs and arms, in various stages of healing.  She has lost consciousness for brief periods of time during these falls.  It began to get even worse starting Friday.  She had further been unsteady on her feet. She also has LE swelling which seemed to be getting worse.  Associated symptoms include leg cramping, twitching in the hands and occasional vomiting.  She denied any paresthesias, tetany, seizures.  She had never lost bowel or bladder control during these episodes.  On the day of admission, she had acute hallucinations, which were new for her.  Apparently her Mother in law stayed over  with her last night and got up to go to church this morning.  When Ms. Deatley awoke, she thought her MIL was still there, but could not find her.  She searched everywhere and then thought she saw her "hanging on a hook" in the bedroom.  She called 911 because she thought her mother in law was dead.  When EMS and police arrived they convinced her to go to the hospital.  She has continued to hallucinate by seeing bugs crawling on the walls.  She is a former alcoholic, but has stopped drinking some years ago and denies ETOH use.  Ethanol level < 5.    Interestingly, she was found to be profoundly hypocalcemic in the ED.  She reported that she takes Prolia for her osteoporosis, and her last injection was 1 month ago.  She also has had gastric bypass surgery about 10 years ago. She had been out of her vitamins, including Calcium and Vitamin D for 2 weeks.  She was also on quite a few psychiatric medications.  When admitting MD, asked her to review them she told her " I already told that other lady, please look in the chart."  She further takes lasix for LE swelling, but has not taken it recently.    ED Course: In the ED, she was found to have a Ca of 5.5, recheck was 6.0.  Corrected for Albumin was 6.1.  Ionized Ca was 6.0.  She received 1gm of IV calcium in the ED.  She was also noted to have an elevated CK to 3700.  Magnesium was normal and  Phos was 6.1  Hospital Course:  #1 bipolar disorder/depression/hallucinations Patient presented with increased falls and hallucinations. Patient noted to be hypocalcemic. Patient also noted to have elevated CK which may have been secondary to patient's Abilify. Patient currently denies any further hallucinations. Continue Abilify. Continue to hold Celexa and Zoloft. Psychiatric consultation pending.  #2 severe hypocalcemia Likely multifactorial secondary to denosumab, lack of vitamin D intake, and possibly poor absorption secondary to history of bypass surgery.  Patient with no tetany spasms. Patient with prolonged QTC on EKG. Patient status post IV calcium gluconate on admission in the ED and on the floor. Magnesium level was normal. Phosphorus elevated at 6.1. Vitamin D level was on the low end of normal at 30.6, PTH that was elevated at 225. Patient was maintained on the Ativan CIWA protocol. Patient was placed on calcium citrate 500 mg by mouth 4 times a day and vitamin D 1000 international units daily patient's calcium levels improved. Patient improved clinically. Patient be discharged home with calcium and vitamin D supplementation. Outpatient follow-up.  #3 hypokalemia Repleted.  #4 elevated CK Questionable etiology. May be secondary to falls versus secondary to Abilify. Troponins were cycled which were negative. Patient was hydrated with IV fluids with improvement with CK levels. CK levels trended down. Outpatient follow-up.   #5 chronic kidney disease Stable.  #6 prolonged QTC on EKG Patient's Flexeril were held as well as Celexa. QTc prolongation had resolved by day of discharge. Patient's Flexeril was discontinued on discharge.   Procedures:  CT at 08/19/2016  Chest x-ray 08/19/2016   Consultations:  None  Discharge Exam: Vitals:   08/21/16 0417 08/21/16 0600  BP: 118/71   Pulse: 62 79  Resp: 18   Temp: 98.2 F (36.8 C)     General: NAD Cardiovascular: RRR Respiratory: CTAB  Discharge Instructions   Discharge Instructions    Diet - low sodium heart healthy    Complete by:  As directed    Increase activity slowly    Complete by:  As directed      Current Discharge Medication List    START taking these medications   Details  calcium citrate (CALCITRATE - DOSED IN MG ELEMENTAL CALCIUM) 950 MG tablet Take 2.5 tablets (500 mg of elemental calcium total) by mouth 4 (four) times daily.    cholecalciferol 1000 units tablet Take 1 tablet (1,000 Units total) by mouth daily.    folic acid (FOLVITE) 1 MG tablet  Take 1 tablet (1 mg total) by mouth daily.    thiamine 100 MG tablet Take 1 tablet (100 mg total) by mouth daily.      CONTINUE these medications which have NOT CHANGED   Details  albuterol (PROVENTIL HFA;VENTOLIN HFA) 108 (90 BASE) MCG/ACT inhaler Inhale 1 puff into the lungs every 6 (six) hours as needed for wheezing or shortness of breath.    ARIPiprazole (ABILIFY) 20 MG tablet Take 20 mg by mouth daily.     citalopram (CELEXA) 40 MG tablet Take 40 mg by mouth daily.     furosemide (LASIX) 20 MG tablet Take 20 mg by mouth every other day.     gabapentin (NEURONTIN) 300 MG capsule Take 300 mg by mouth 3 (three) times daily.    KLOR-CON 10 10 MEQ tablet Take 20 mEq by mouth 2 (two) times daily.    LORazepam (ATIVAN) 1 MG tablet Take 1 mg by mouth 2 (two) times daily as needed for anxiety.    oxyCODONE-acetaminophen (PERCOCET) 10-325 MG tablet Take  1 tablet by mouth every 4 (four) hours as needed for pain.    promethazine (PHENERGAN) 25 MG tablet Take 25 mg by mouth every 6 (six) hours as needed for nausea.       STOP taking these medications     cyclobenzaprine (FLEXERIL) 10 MG tablet      triamterene-hydrochlorothiazide (MAXZIDE) 75-50 MG tablet      fluticasone (FLONASE) 50 MCG/ACT nasal spray        Allergies  Allergen Reactions  . Prednisone Other (See Comments)    Makes me crazy    Follow-up Information    WHITE,CYNTHIA S, MD. Schedule an appointment as soon as possible for a visit in 2 week(s).   Specialty:  Family Medicine Why:  f/u in 2 weeks. Contact information: [address] [place] [address] [phone]            The results of significant diagnostics from this hospitalization (including imaging, microbiology, ancillary and laboratory) are listed below for reference.    Significant Diagnostic Studies: Dg Chest 2 View  Result Date: 08/19/2016 CLINICAL DATA:  Fall.  Altered mental status.  Hallucinations. EXAM: CHEST  2  VIEW COMPARISON:  Chest and rib radiographs 06/18/2016 FINDINGS: The heart is mildly enlarged. Changes of COPD are again noted. There is no focal airspace disease. No acute fracture or pneumothorax is present. Degenerative changes are present at the shoulders. IMPRESSION: 1. Cardiomegaly without failure. 2. COPD. 3. No acute cardiopulmonary disease. Electronically Signed   By: Marin Robertshristopher  Mattern M.D.   On: 08/19/2016 18:54   Ct Head Wo Contrast  Result Date: 08/19/2016 CLINICAL DATA:  66 year old female with multiple falls and altered mental status. EXAM: CT HEAD WITHOUT CONTRAST TECHNIQUE: Contiguous axial images were obtained from the base of the skull through the vertex without intravenous contrast. COMPARISON:  Brain MRI dated 07/13/2016 and head CT dated 08/11/2010 FINDINGS: Brain: The ventricles and sulci appropriate in size for patient's age. Mild periventricular and deep white matter chronic microvascular ischemic changes predominantly involving the right corona radiata and centrum semiovale. There is no acute intracranial hemorrhage. No mass effect or midline shift noted. Vascular: No hyperdense vessel or unexpected calcification. Skull: Normal. Negative for fracture or focal lesion. Sinuses/Orbits: No acute finding. Other: None IMPRESSION: No acute intracranial hemorrhage. Age-related atrophy and chronic microvascular ischemic disease. If symptoms persist and there are no contraindications, MRI may provide better evaluation if clinically indicated. Electronically Signed   By: Elgie CollardArash  Radparvar M.D.   On: 08/19/2016 21:37    Microbiology: Recent Results (from the past 240 hour(s))  Urine culture     Status: None   Collection Time: 08/19/16 10:46 PM  Result Value Ref Range Status   Specimen Description URINE, RANDOM  Final   Special Requests NONE  Final   Culture NO GROWTH  Final   Report Status 08/21/2016 FINAL  Final     Labs: Basic Metabolic Panel:  Recent Labs Lab 08/19/16 2027   08/20/16 0408 08/20/16 0700 08/20/16 1129 08/20/16 1532 08/21/16 0424  NA  --   < > 136 138 140 139 142  K  --   < > 3.2* 3.2* 3.1* 3.4* 3.2*  CL  --   < > 97* 100* 103 108 109  CO2  --   < > 27 26 27 22 26   GLUCOSE  --   < > 111* 139* 124* 157* 113*  BUN  --   < > 29* 28* 24* 19 12  CREATININE  --   < > 1.41* 1.27* 1.26* 0.98 0.74  CALCIUM  --   < > 6.8* 6.7* 6.8* 6.7* 7.2*  MG 2.0  --  2.0  --   --   --  1.8  PHOS 6.1*  --   --   --   --   --   --   < > = values in this interval not displayed. Liver Function Tests:  Recent Labs Lab 08/19/16 1750 08/20/16 0408 08/21/16 0424  AST 104* 98* 57*  ALT 40 46 40  ALKPHOS 66 69 55  BILITOT 2.1* 1.5* 1.2  PROT 5.7* 5.7* 4.8*  ALBUMIN 3.2* 3.3* 2.6*   No results for input(s): LIPASE, AMYLASE in the last 168 hours.  Recent Labs Lab 08/19/16 1817  AMMONIA 22   CBC:  Recent Labs Lab 08/19/16 1750 08/20/16 0408 08/21/16 0424  WBC 9.3 8.2 5.1  NEUTROABS 7.5  --   --   HGB 10.8* 11.2* 10.3*  HCT 34.9* 35.2* 32.2*  MCV 89.5 87.8 89.9  PLT 140* 254 199   Cardiac Enzymes:  Recent Labs Lab 08/19/16 2027 08/20/16 0408 08/20/16 0816 08/20/16 1129 08/21/16 0424  CKTOTAL 3,777* 3,816*  --   --  1,822*  TROPONINI  --   --  <0.03 0.03*  --    BNP: BNP (last 3 results) No results for input(s): BNP in the last 8760 hours.  ProBNP (last 3 results) No results for input(s): PROBNP in the last 8760 hours.  CBG:  Recent Labs Lab 08/19/16 1742 08/21/16 0743  GLUCAP 71 110*       Signed:  THOMPSON,DANIEL MD.  [facility] 08/21/2016, 2:18 PM

## 2016-08-21 NOTE — Evaluation (Signed)
Occupational Therapy Evaluation Patient Details Name: [name] MRN: [id number] DOB: 07/14/1950 Today's Date: 08/21/2016    History of Present Illness Pt is a 66 y.o. female who presented to the ED with falls and hallucinations. She was found to be hypocalcemic, hypokalemic, and elevated CK. She has a PMH significant for attention deficits disorder, bipolar 1 disorder, chronic alcohol abuse, chronic edema, chronic hypokalemia, depression, hyperactivity disorder, hypoalbuminemia, iron deficiency anemia, malnutrition, mood disorder, and Wolff-Parkinson-White syndrome.   Clinical Impression   PTA, pt was independent with ADL and functional mobility. Over the past few days, she reports hallucinations but that these have resolved. No hallucinations during OT evaluation. She does demonstrate decreased B UE strength and decreased dynamic standing balance impacting independence with ADL tasks. She would benefit from continued OT services while admitted to improve independence with ADL and functional mobility. Recommend intermittent supervision from family post-acute D/C. Will continue to follow acutely.    Follow Up Recommendations  No OT follow up;Supervision - Intermittent    Equipment Recommendations  None recommended by OT    Recommendations for Other Services       Precautions / Restrictions Precautions Precautions: Fall Restrictions Weight Bearing Restrictions: No      Mobility Bed Mobility Overal bed mobility: Needs Assistance Bed Mobility: Supine to Sit     Supine to sit: Supervision     General bed mobility comments: Supervision for safety.  Transfers Overall transfer level: Needs assistance Equipment used: None Transfers: Sit to/from Stand Sit to Stand: Supervision         General transfer comment: Supervision for sit<>stand.    Balance Overall balance assessment: Needs assistance Sitting-balance support: No upper extremity supported;Feet  supported Sitting balance-Leahy Scale: Good     Standing balance support: No upper extremity supported;During functional activity Standing balance-Leahy Scale: Fair Standing balance comment: Min guard assist for safety during dynamic activities.                            ADL Overall ADL's : Needs assistance/impaired Eating/Feeding: Set up;Sitting   Grooming: Supervision/safety;Standing   Upper Body Bathing: Set up;Sitting   Lower Body Bathing: Min guard;Sit to/from stand   Upper Body Dressing : Set up;Sitting   Lower Body Dressing: Min guard;Sit to/from stand   Toilet Transfer: Min guard;BSC;Ambulation   Toileting- Architect and Hygiene: Min guard;Sit to/from stand   Tub/ Shower Transfer: Min guard;Ambulation   Functional mobility during ADLs: Min guard General ADL Comments: Educated pt on safe shower transfers. Pt demonstrates decreased activity tolerance for ADL participation.      Vision Baseline Vision/History: Wears glasses Wears Glasses: At all times Patient Visual Report: No change from baseline Vision Assessment?: No apparent visual deficits Additional Comments: No hallucinations during evaluation. Pt reports that these have resolved.     Perception     Praxis      Pertinent Vitals/Pain Pain Assessment: No/denies pain     Hand Dominance Right   Extremity/Trunk Assessment Upper Extremity Assessment Upper Extremity Assessment: Generalized weakness   Lower Extremity Assessment Lower Extremity Assessment: Defer to PT evaluation       Communication Communication Communication: No difficulties   Cognition Arousal/Alertness: Awake/alert Behavior During Therapy: WFL for tasks assessed/performed Overall Cognitive Status: No family/caregiver present to determine baseline cognitive functioning                 General Comments: Pt able to complete problem solving tasks well. She did  demonstrate some hyperfocus on topics  during conversation.   General Comments       Exercises       Shoulder Instructions      Home Living Family/patient expects to be discharged to:: Private residence Living Arrangements: Alone Available Help at Discharge: Family;Available 24 hours/day (mother in law for 2 days; intermittent after that) Type of Home: House             Bathroom Shower/Tub: Tub/shower unit Shower/tub characteristics: Curtain       Home Equipment: None          Prior Functioning/Environment Level of Independence: Independent                 OT Problem List: Decreased strength;Decreased activity tolerance;Impaired balance (sitting and/or standing);Decreased safety awareness;Decreased knowledge of use of DME or AE;Decreased knowledge of precautions      OT Treatment/Interventions: Self-care/ADL training;Therapeutic exercise;Energy conservation;DME and/or AE instruction;Therapeutic activities;Patient/family education;Balance training    OT Goals(Current goals can be found in the care plan section) Acute Rehab OT Goals Patient Stated Goal: to go home today OT Goal Formulation: With patient Time For Goal Achievement: 09/04/16 Potential to Achieve Goals: Good ADL Goals Pt Will Perform Grooming: with modified independence;standing (3 tasks) Pt Will Perform Tub/Shower Transfer: with modified independence;Tub transfer;ambulating Pt/caregiver will Perform Home Exercise Program: Increased strength;Left upper extremity;With written HEP provided;Right Upper extremity;Independently Additional ADL Goal #1: Pt will demonstrate improved activity tolerance to stand at sink for approximately 15 minutes for grooming tasks with no rest breaks.  OT Frequency: Min 1X/week   Barriers to D/C:            Co-evaluation              End of Session Nurse Communication: Mobility status  Activity Tolerance: Patient tolerated treatment well Patient left: in bed;with call bell/phone within  reach  OT Visit Diagnosis: Other abnormalities of gait and mobility (R26.89);Repeated falls (R29.6);Muscle weakness (generalized) (M62.81)                ADL either performed or assessed with clinical judgement  Time: 1115-1130 OT Time Calculation (min): 15 min Charges:  OT General Charges $OT Visit: 1 Procedure OT Evaluation $OT Eval Moderate Complexity: 1 Procedure G-Codes:     Doristine Sectionharity A Holley Wirt, MS OTR/L  Pager: [phone]   Miosha Behe A Alexander Mcauley 08/21/2016, 1:34 PM

## 2016-08-21 NOTE — Evaluation (Signed)
Physical Therapy Evaluation Patient Details Name: [name] MRN: [id number] DOB: 01/12/1951 Today's Date: 08/21/2016   History of Present Illness  Pt is a 66 y.o. female who presented to the ED with falls and hallucinations. She was found to be hypocalcemic, hypokalemic, and elevated CK. She has a PMH significant for attention deficits disorder, bipolar 1 disorder, chronic alcohol abuse, chronic edema, chronic hypokalemia, depression, hyperactivity disorder, hypoalbuminemia, iron deficiency anemia, malnutrition, mood disorder, and Wolff-Parkinson-White syndrome.  Clinical Impression  Pt is up to move with IV pole, has been able to maneuver in her room alone with family nearby observing.  Her hope is to leave soon, and will not need follow up therapy but will benefit from PT and nursing encouraging mobility as tolerated in the hospital to increase her standing endurance.  Will follow acutely for this and to observe her for any other issues, including attempts with the stairs.    Follow Up Recommendations No PT follow up;Supervision - Intermittent    Equipment Recommendations  None recommended by PT    Recommendations for Other Services       Precautions / Restrictions Precautions Precautions: Fall (telemtry) Restrictions Weight Bearing Restrictions: No      Mobility  Bed Mobility Overal bed mobility: Modified Independent Bed Mobility: Supine to Sit     Supine to sit: Supervision     General bed mobility comments: Supervision for safety.  Transfers Overall transfer level: Needs assistance Equipment used: None Transfers: Sit to/from UGI CorporationStand;Stand Pivot Transfers Sit to Stand: Supervision Stand pivot transfers: Supervision       General transfer comment: safety only  Ambulation/Gait Ambulation/Gait assistance: Supervision Ambulation Distance (Feet): 150 Feet Assistive device: None (pt pushed her IV pole ) Gait Pattern/deviations: Step-through pattern;Wide base  of support;Decreased stride length (minimal maneuvering but IV pole base is awkward) Gait velocity: reduced Gait velocity interpretation: Below normal speed for age/gender    Stairs Stairs:  (deferred)          Wheelchair Mobility    Modified Rankin (Stroke Patients Only)       Balance Overall balance assessment: Needs assistance Sitting-balance support: Feet supported Sitting balance-Leahy Scale: Good     Standing balance support: No upper extremity supported;During functional activity Standing balance-Leahy Scale: Fair Standing balance comment: supervised for safety                             Pertinent Vitals/Pain Pain Assessment: Faces Faces Pain Scale: Hurts little more Pain Location: L leg bruising Pain Descriptors / Indicators: Sore Pain Intervention(s): Monitored during session;Repositioned    Home Living Family/patient expects to be discharged to:: Private residence Living Arrangements: Alone Available Help at Discharge: Family;Available 24 hours/day Type of Home: House Home Access: Stairs to enter Entrance Stairs-Rails: Can reach both;Right;Left Entrance Stairs-Number of Steps: 3 Home Layout: One level Home Equipment: None      Prior Function Level of Independence: Independent               Hand Dominance   Dominant Hand: Right    Extremity/Trunk Assessment   Upper Extremity Assessment Upper Extremity Assessment: Overall WFL for tasks assessed    Lower Extremity Assessment Lower Extremity Assessment: Overall WFL for tasks assessed       Communication   Communication: No difficulties  Cognition Arousal/Alertness: Awake/alert Behavior During Therapy: WFL for tasks assessed/performed Overall Cognitive Status: Within Functional Limits for tasks assessed  General Comments: anxious about getting released to home but cooperative    General Comments General comments (skin integrity, edema, etc.): Pt  is up to BR alone with her IV pole when PT arrived, has been walking without staff assist in the room    Exercises     Assessment/Plan    PT Assessment Patient needs continued PT services  PT Problem List Decreased activity tolerance;Decreased mobility;Cardiopulmonary status limiting activity;Decreased skin integrity;Pain       PT Treatment Interventions DME instruction;Gait training;Stair training;Functional mobility training;Therapeutic activities;Therapeutic exercise;Balance training;Neuromuscular re-education;Patient/family education    PT Goals (Current goals can be found in the Care Plan section)  Acute Rehab PT Goals Patient Stated Goal: get home today PT Goal Formulation: With patient Time For Goal Achievement: 08/28/16 Potential to Achieve Goals: Good    Frequency Min 3X/week   Barriers to discharge Decreased caregiver support;Inaccessible home environment lives alone    Co-evaluation               End of Session Equipment Utilized During Treatment: Gait belt Activity Tolerance: Patient tolerated treatment well;No increased pain Patient left: in bed;with call bell/phone within reach;with family/visitor present (sat on bed) Nurse Communication: Mobility status PT Visit Diagnosis: Other abnormalities of gait and mobility (R26.89);Repeated falls (R29.6)    Functional Assessment Tool Used: AM-PAC 6 Clicks Basic Mobility Functional Limitation: Mobility: Walking and moving around Mobility: Walking and Moving Around Current Status [phone]): At least 20 percent but less than 40 percent impaired, limited or restricted Mobility: Walking and Moving Around Goal Status [phone]): At least 1 percent but less than 20 percent impaired, limited or restricted    Time: 1329-1355 PT Time Calculation (min) (ACUTE ONLY): 26 min   Charges:   PT Evaluation $PT Eval Low Complexity: 1 Procedure PT Treatments $Gait Training: 8-22 mins   PT G Codes:   PT G-Codes **NOT FOR  INPATIENT CLASS** Functional Assessment Tool Used: AM-PAC 6 Clicks Basic Mobility Functional Limitation: Mobility: Walking and moving around Mobility: Walking and Moving Around Current Status ([id number]): At least 20 percent but less than 40 percent impaired, limited or restricted Mobility: Walking and Moving Around Goal Status [phone]): At least 1 percent but less than 20 percent impaired, limited or restricted     Ivar Drape 08/21/2016, 2:25 PM   Samul Dada, PT MS Acute Rehab Dept. Number: [facility] [id number] and [facility] [phone]

## 2016-08-21 NOTE — Progress Notes (Addendum)
Laurie GathersBillie B Cox to be D/C'd Home per MD order.  Discussed with the patient and all questions fully answered.  VSS. IV catheter discontinued intact. Site without signs and symptoms of complications. Dressing and pressure applied.  An After Visit Summary was printed and given to the patient. Patient received prescription.  D/c education completed with patient/family including follow up instructions, medication list, d/c activities limitations if indicated, with other d/c instructions as indicated by MD - patient able to verbalize understanding, all questions fully answered.   Patient instructed to return to ED, call 911, or call MD for any changes in condition.   Patient refused escorted via WC.  [name] C 08/21/2016 3:07 PM

## 2016-08-31 ENCOUNTER — Other Ambulatory Visit (HOSPITAL_COMMUNITY): Payer: Medicare Other

## 2016-09-12 ENCOUNTER — Ambulatory Visit (HOSPITAL_COMMUNITY): Payer: Medicare Other | Attending: Internal Medicine

## 2016-09-17 ENCOUNTER — Encounter: Payer: Self-pay | Admitting: *Deleted

## 2016-09-18 ENCOUNTER — Ambulatory Visit: Payer: Medicare Other | Admitting: Diagnostic Neuroimaging

## 2016-09-19 ENCOUNTER — Encounter: Payer: Self-pay | Admitting: Diagnostic Neuroimaging

## 2016-10-08 ENCOUNTER — Encounter: Payer: Self-pay | Admitting: *Deleted

## 2016-10-09 ENCOUNTER — Ambulatory Visit: Payer: Self-pay | Admitting: Diagnostic Neuroimaging

## 2016-10-09 DEATH — deceased

## 2016-10-10 ENCOUNTER — Encounter: Payer: Self-pay | Admitting: Diagnostic Neuroimaging
